# Patient Record
Sex: Female | Born: 1955 | Race: White | Hispanic: No | Smoking: Never smoker
Health system: Southern US, Community
[De-identification: ages and names within clinical notes are randomized; demographics above are authoritative.]

## PROBLEM LIST (undated history)

## (undated) DIAGNOSIS — R079 Chest pain, unspecified: Secondary | ICD-10-CM

## (undated) DIAGNOSIS — G8929 Other chronic pain: Secondary | ICD-10-CM

## (undated) DIAGNOSIS — E559 Vitamin D deficiency, unspecified: Secondary | ICD-10-CM

## (undated) DIAGNOSIS — R0602 Shortness of breath: Secondary | ICD-10-CM

## (undated) DIAGNOSIS — E669 Obesity, unspecified: Secondary | ICD-10-CM

## (undated) DIAGNOSIS — M25562 Pain in left knee: Secondary | ICD-10-CM

## (undated) DIAGNOSIS — F419 Anxiety disorder, unspecified: Secondary | ICD-10-CM

## (undated) DIAGNOSIS — E7800 Pure hypercholesterolemia, unspecified: Secondary | ICD-10-CM

## (undated) DIAGNOSIS — M797 Fibromyalgia: Secondary | ICD-10-CM

## (undated) DIAGNOSIS — M25561 Pain in right knee: Secondary | ICD-10-CM

## (undated) DIAGNOSIS — M2142 Flat foot [pes planus] (acquired), left foot: Secondary | ICD-10-CM

## (undated) DIAGNOSIS — F32A Depression, unspecified: Secondary | ICD-10-CM

## (undated) DIAGNOSIS — M1990 Unspecified osteoarthritis, unspecified site: Secondary | ICD-10-CM

## (undated) DIAGNOSIS — M2141 Flat foot [pes planus] (acquired), right foot: Secondary | ICD-10-CM

## (undated) DIAGNOSIS — M2550 Pain in unspecified joint: Secondary | ICD-10-CM

## (undated) DIAGNOSIS — D649 Anemia, unspecified: Secondary | ICD-10-CM

## (undated) HISTORY — DX: Fibromyalgia: M79.7

## (undated) HISTORY — DX: Anemia, unspecified: D64.9

## (undated) HISTORY — DX: Obesity, unspecified: E66.9

## (undated) HISTORY — DX: Pain in unspecified joint: M25.50

## (undated) HISTORY — DX: Vitamin D deficiency, unspecified: E55.9

## (undated) HISTORY — DX: Pure hypercholesterolemia, unspecified: E78.00

## (undated) HISTORY — DX: Flat foot (pes planus) (acquired), left foot: M21.42

## (undated) HISTORY — DX: Flat foot (pes planus) (acquired), right foot: M21.41

## (undated) HISTORY — DX: Pain in right knee: M25.562

## (undated) HISTORY — DX: Chest pain, unspecified: R07.9

## (undated) HISTORY — DX: Pain in left knee: M25.561

## (undated) HISTORY — DX: Unspecified osteoarthritis, unspecified site: M19.90

## (undated) HISTORY — DX: Other chronic pain: G89.29

## (undated) HISTORY — DX: Depression, unspecified: F32.A

## (undated) HISTORY — DX: Shortness of breath: R06.02

## (undated) HISTORY — DX: Anxiety disorder, unspecified: F41.9

## (undated) HISTORY — PX: TONSILLECTOMY: SUR1361

---

## 1989-02-01 HISTORY — PX: ABDOMINAL HYSTERECTOMY: SHX81

## 1989-02-01 HISTORY — PX: TOTAL ABDOMINAL HYSTERECTOMY: SHX209

## 2009-07-17 ENCOUNTER — Ambulatory Visit: Payer: Self-pay | Admitting: Family Medicine

## 2009-07-17 DIAGNOSIS — M79609 Pain in unspecified limb: Secondary | ICD-10-CM | POA: Insufficient documentation

## 2009-07-17 DIAGNOSIS — M797 Fibromyalgia: Secondary | ICD-10-CM | POA: Insufficient documentation

## 2009-07-17 DIAGNOSIS — N39498 Other specified urinary incontinence: Secondary | ICD-10-CM | POA: Insufficient documentation

## 2009-07-17 LAB — CONVERTED CEMR LAB
Bilirubin Urine: NEGATIVE
Blood in Urine, dipstick: NEGATIVE
Glucose, Urine, Semiquant: NEGATIVE
Ketones, urine, test strip: NEGATIVE
Nitrite: NEGATIVE
WBC Urine, dipstick: NEGATIVE

## 2009-07-18 LAB — CONVERTED CEMR LAB
Albumin: 4.6 g/dL (ref 3.5–5.2)
BUN: 16 mg/dL (ref 6–23)
CO2: 23 meq/L (ref 19–32)
Glucose, Bld: 101 mg/dL — ABNORMAL HIGH (ref 70–99)
HCT: 42.4 % (ref 36.0–46.0)
Hemoglobin: 13.7 g/dL (ref 12.0–15.0)
MCHC: 32.3 g/dL (ref 30.0–36.0)
Magnesium: 2 mg/dL (ref 1.5–2.5)
Potassium: 4.4 meq/L (ref 3.5–5.3)
RBC: 4.62 M/uL (ref 3.87–5.11)
Sodium: 137 meq/L (ref 135–145)
Total CK: 142 units/L (ref 7–177)
Total Protein: 6.8 g/dL (ref 6.0–8.3)

## 2009-07-19 ENCOUNTER — Telehealth: Payer: Self-pay | Admitting: Family Medicine

## 2009-07-31 ENCOUNTER — Ambulatory Visit: Payer: Self-pay | Admitting: Family Medicine

## 2009-07-31 DIAGNOSIS — N318 Other neuromuscular dysfunction of bladder: Secondary | ICD-10-CM | POA: Insufficient documentation

## 2009-08-01 ENCOUNTER — Encounter: Admission: RE | Admit: 2009-08-01 | Discharge: 2009-08-29 | Payer: Self-pay | Admitting: Family Medicine

## 2009-08-16 ENCOUNTER — Encounter: Payer: Self-pay | Admitting: Family Medicine

## 2009-08-28 ENCOUNTER — Ambulatory Visit: Payer: Self-pay | Admitting: Family Medicine

## 2009-11-27 ENCOUNTER — Telehealth: Payer: Self-pay | Admitting: Family Medicine

## 2009-11-27 ENCOUNTER — Ambulatory Visit: Payer: Self-pay | Admitting: Family Medicine

## 2009-11-27 LAB — CONVERTED CEMR LAB
Bilirubin Urine: NEGATIVE
Glucose, Urine, Semiquant: NEGATIVE
Ketones, urine, test strip: NEGATIVE
Nitrite: NEGATIVE
Protein, U semiquant: NEGATIVE
Specific Gravity, Urine: <1.005
Urobilinogen, UA: 0.2
WBC Urine, dipstick: NEGATIVE
pH: 6

## 2009-11-27 LAB — HM MAMMOGRAPHY: HM Mammogram: NORMAL

## 2009-12-19 ENCOUNTER — Encounter: Payer: Self-pay | Admitting: Family Medicine

## 2010-01-03 ENCOUNTER — Ambulatory Visit: Payer: Self-pay | Admitting: Family Medicine

## 2010-04-02 ENCOUNTER — Ambulatory Visit: Payer: Self-pay | Admitting: Family Medicine

## 2010-04-02 DIAGNOSIS — F341 Dysthymic disorder: Secondary | ICD-10-CM | POA: Insufficient documentation

## 2010-04-30 ENCOUNTER — Ambulatory Visit: Payer: Self-pay | Admitting: Family Medicine

## 2010-06-03 NOTE — Progress Notes (Signed)
Summary: PT for bladder  Phone Note Call from Patient Call back at Work Phone [phone]   Caller: Patient Call For: Carolyn Gasser MD Summary of Call: pt calls back and would like to have the PT fior her bladder scheduled Initial call taken by: Kathlene November,  July 19, 2009 9:27 AM

## 2010-06-03 NOTE — Assessment & Plan Note (Signed)
Summary: RASH/POISON OAK   Vital Signs:  Patient profile:   55 year old female Height:      65 inches Weight:      190 pounds Pulse rate:   75 / minute BP sitting:   127 / 88  (left arm) Cuff size:   regular  Vitals Entered By: Avon Gully CMA, Duncan Dull) (January 03, 2010 2:48 PM) CC: possible poison ivey,pt was working in the yard yesterday and she did get bit by several ants, develpoed itchy weeping rash on the arms   Primary Care Alania Overholt:  Nani Gasser MD  CC:  possible poison ivey, pt was working in the yard yesterday and she did get bit by several ants, and develpoed itchy weeping rash on the arms.  History of Present Illness: possible poison ivey,pt was working in the yard yesterday and she did get bit by several ants, develpoed itchy weeping rash on the arms. Was bitten on Tuesdays andthen next noticed very itchy and then then following day noticed the welps  on her forearms.  Even has new lesions this AM.  Using Iverest and ivadry on it and not really helping.    Current Medications (verified): 1)  Vivelle-Dot 0.05 Mg/24hr Pttw (Estradiol) .... Change Patch Twice A Week 2)  Flexeril 5 Mg Tabs (Cyclobenzaprine Hcl) .... 1/2 - 1 Tab By Mouth At Bedtime. 3)  Diclofenac Sodium 75 Mg Tbec (Diclofenac Sodium) .... Take 1 Tablet By Mouth Two Times A Day As Needed 4)  Bactrim Ds 800-160 Mg Tabs (Sulfamethoxazole-Trimethoprim) .... Take 1 Tablet By Mouth Two Times A Day For 3 Days.  Allergies (verified): No Known Drug Allergies  Comments:  Nurse/Medical Assistant: The patient's medications and allergies were reviewed with the patient and were updated in the Medication and Allergy Lists. Avon Gully CMA, Duncan Dull) (January 03, 2010 2:49 PM)  Physical Exam  General:  Well-developed,well-nourished,in no acute distress; alert,appropriate and cooperative throughout examination Skin:  She has erythematou very raised papules scattered on her left forearm that are 0.5  to 1 cm in size. Has a vesicular lesion on her right wrist.    Impression & Recommendations:  Problem # 1:  RHUS DERMATITIS (ICD-692.6) some of the lesion look like ant bites and some look like allergic dermatitis from poison ivey or oak. Dsicussed topical vs oral treatment and that it is not contagious or "spreading" .  She prefered oral steroids. Discussed the likely SE of the medication. Call if not better in one week.  Her updated medication list for this problem includes:    Prednisone 20 Mg Tabs (Prednisone) .Marland Kitchen... 2 tab by mouth daily for 7 days the once tab a day for 7 days and then stop.  Complete Medication List: 1)  Vivelle-dot 0.05 Mg/24hr Pttw (Estradiol) .... Change patch twice a week 2)  Flexeril 5 Mg Tabs (Cyclobenzaprine hcl) .... 1/2 - 1 tab by mouth at bedtime. 3)  Diclofenac Sodium 75 Mg Tbec (Diclofenac sodium) .... Take 1 tablet by mouth two times a day as needed 4)  Bactrim Ds 800-160 Mg Tabs (Sulfamethoxazole-trimethoprim) .... Take 1 tablet by mouth two times a day for 3 days. 5)  Prednisone 20 Mg Tabs (Prednisone) .... 2 tab by mouth daily for 7 days the once tab a day for 7 days and then stop.  Patient Instructions: 1)  Call if not better in one week.  Prescriptions: PREDNISONE 20 MG TABS (PREDNISONE) 2 tab by mouth daily for 7 days the once tab a  day for 7 days and then stop.  #21 x 0   Entered and Authorized by:   Nani Gasser MD   Signed by:   Nani Gasser MD on 01/03/2010   Method used:   Electronically to        [facility] (retail)       [address]       [place], [place]  [address]       Ph: [phone]       Fax: [phone]   RxID:   [id number]

## 2010-06-03 NOTE — Progress Notes (Signed)
Summary: Mammo resutls  Phone Note Outgoing Call      Call pt: Normal Mammogram. Return for follow up Mammogram in 1 year Done at [facility]. July 27, 201112:23 PM Keona Sheffler MD, Santina Evans  11/27/09 left a message on voicmail that results are normal.acm1:30

## 2010-06-03 NOTE — Assessment & Plan Note (Signed)
Summary: 4 mo. f/u fibro, mood   Vital Signs:  Patient profile:   55 year old female Height:      65 inches Weight:      192 pounds Pulse rate:   77 / minute BP sitting:   121 / 77  (right arm) Cuff size:   regular  Vitals Entered By: Avon Gully CMA, Duncan Dull) (April 02, 2010 8:43 AM) CC: f/u fibromyalgia   Primary Care Provider:  Nani Gasser MD  CC:  f/u fibromyalgia.  History of Present Illness: f/u fibromyalgia.  Says her fibromyalgia has been flaring.  Still struggling with her neck and shoulder pain.  Hasn't been donig the exercises.  Feels her mood is down. Has been very stressed at work and at home.  will occ take the flexeril and it does help but occ does wake up.  She is tearful here in the office today. she definitely says she feels to have and somewhat anxious.  was on zoloft for year about 10 or 12 years ago.  She said she tolerated this really well and did not have any side effects.  She did find that it was helpful at that time.  Current Medications (verified): 1)  Vivelle-Dot 0.05 Mg/24hr Pttw (Estradiol) .... Change Patch Twice A Week 2)  Flexeril 5 Mg Tabs (Cyclobenzaprine Hcl) .... 1/2 - 1 Tab By Mouth At Bedtime. 3)  Diclofenac Sodium 75 Mg Tbec (Diclofenac Sodium) .... Take 1 Tablet By Mouth Two Times A Day As Needed  Allergies (verified): No Known Drug Allergies  Comments:  Nurse/Medical Assistant: The patient's medications and allergies were reviewed with the patient and were updated in the Medication and Allergy Lists. Avon Gully CMA, Duncan Dull) (April 02, 2010 8:44 AM)  Physical Exam  General:  Well-developed,well-nourished,in no acute distress; alert,appropriate and cooperative throughout examination Head:  Normocephalic and atraumatic without obvious abnormalities. No apparent alopecia or balding. Lungs:  Normal respiratory effort, chest expands symmetrically. Lungs are clear to auscultation, no crackles or wheezes. Heart:   Normal rate and regular rhythm. S1 and S2 normal without gallop, murmur, click, rub or other extra sounds. Psych:  Cognition and judgment appear intact. Alert and cooperative with normal attention span and concentration. No apparent delusions, illusions, hallucinations. she is tearful.   Impression & Recommendations:  Problem # 1:  FIBROMYALGIA (ICD-729.1) Assessment Deteriorated we discussed that certainly her fibromyalgia could be flaring because of her recent change in mood.  I think we should focus in on treating her depression and anxiety and see if her fibromyalgia improved.  In addition regular exercise and stretching will help her.  I reminded her to be as consistent with this as she can. Her updated medication list for this problem includes:    Flexeril 5 Mg Tabs (Cyclobenzaprine hcl) .Marland Kitchen... 1/2 - 1 tab by mouth at bedtime.    Diclofenac Sodium 75 Mg Tbec (Diclofenac sodium) .Marland Kitchen... Take 1 tablet by mouth two times a day as needed  Problem # 2:  DEPRESSION/ANXIETY (ICD-300.4) Assessment: New PHQ-9 score of 19 (moderately severe) GAD-7 score of 13. In the she clearly has anxiety and depression that I think her depression and is more prominent.  We discussed different options.  She has done well on the Zoloft in the past I would like to restart this as of the days.  Then follow her up in 3 to 4 weeks and we can adjust her dose and she shall she's doing at that time.  Discussed potential side effects.  Call if she has any thoughts of harming herself.  She currently denies any suicidal thoughts.I also offered counseling, but she was not interested at this time.  Complete Medication List: 1)  Vivelle-dot 0.05 Mg/24hr Pttw (Estradiol) .... Change patch twice a week 2)  Flexeril 5 Mg Tabs (Cyclobenzaprine hcl) .... 1/2 - 1 tab by mouth at bedtime. 3)  Diclofenac Sodium 75 Mg Tbec (Diclofenac sodium) .... Take 1 tablet by mouth two times a day as needed 4)  Sertraline Hcl 50 Mg Tabs (Sertraline  hcl) .... 1/2 tab by mouth daily for one week then increase to whole tab.  Patient Instructions: 1)  Please schedule a follow-up appointment in 3-4 weeks for mood.  Prescriptions: SERTRALINE HCL 50 MG TABS (SERTRALINE HCL) 1/2 tab by mouth daily for one week then increase to whole tab.  #30 x 0   Entered and Authorized by:   Nani Gasser MD   Signed by:   Nani Gasser MD on 04/02/2010   Method used:   Electronically to        [facility] (retail)       [address]       [place], [place]  [address]       Ph: [phone]       Fax: [phone]   RxID:   [id number]    Orders Added: 1)  Est. Patient Level IV [56433]

## 2010-06-03 NOTE — Assessment & Plan Note (Signed)
Summary: 3 month fu fibro, UTI   Vital Signs:  Patient profile:   55 year old female Height:      65 inches Weight:      190 pounds Pulse rate:   84 / minute BP sitting:   119 / 73  (left arm) Cuff size:   regular  Vitals Entered By: Avon Gully CMA, Duncan Dull) (November 27, 2009 9:10 AM) CC: f/u fibromyalgia,doing better overall  but not sleeping at night farom discomfort,cramping and lower abd ppain on off for past week   Primary Care Provider:  Nani Gasser MD  CC:  f/u fibromyalgia, doing better overall  but not sleeping at night farom discomfort, and cramping and lower abd ppain on off for past week.  History of Present Illness: f/u fibromyalgia,doing better overall  but not sleeping at night from discomfort,cramping and lower abd pain on off for past week. Had urinary urgency and dysuria.  Started AZO and did help some.  Still not sleeping well last few weeks.  Has mentally been more stressed.  Didn't try goingup on half of tab of the flexeril.  Sometime will get stress at work and will feel some tension and prickling in her shoulders. Not doing any stretchdes or exercises for her neck and shoulders. Has been walking more.    ENd of April fell and hit the back of her head adn had a goose egg.  It eventually went down.    Current Medications (verified): 1)  Vivelle-Dot 0.05 Mg/24hr Pttw (Estradiol) .... Change Patch Twice A Week 2)  Flexeril 5 Mg Tabs (Cyclobenzaprine Hcl) .... 1/2 - 1 Tab By Mouth At Bedtime. 3)  Diclofenac Sodium 75 Mg Tbec (Diclofenac Sodium) .... Take 1 Tablet By Mouth Two Times A Day As Needed  Allergies (verified): No Known Drug Allergies  Comments:  Nurse/Medical Assistant: The patient's medications and allergies were reviewed with the patient and were updated in the Medication and Allergy Lists. Avon Gully CMA, Duncan Dull) (November 27, 2009 9:12 AM)  Physical Exam  General:  Well-developed,well-nourished,in no acute distress; alert,appropriate  and cooperative throughout examination Head:  Normocephalic and atraumatic without obvious abnormalities. No apparent alopecia or balding. Lungs:  Normal respiratory effort, chest expands symmetrically. Lungs are clear to auscultation, no crackles or wheezes. Heart:  Normal rate and regular rhythm. S1 and S2 normal without gallop, murmur, click, rub or other extra sounds. Msk:  Neck with normal flexion, extenion, rotation. Slightly limited sidebend bilat and painful in both directions Shoulder with NROM and strength 5/5.  Skin:  no rashes.   Psych:  Cognition and judgment appear intact. Alert and cooperative with normal attention span and concentration. No apparent delusions, illusions, hallucinations   Impression & Recommendations:  Problem # 1:  FIBROMYALGIA (ICD-729.1) Assessment Unchanged Can try increasing her flexeril to 1 tab and see if helps her sleep.  REally encouraged her toget back exercise overall and will give her handout for neck and shoulder stretches. f/u in 4 months.  Monitor for GI SE with the diclofenac.   Her updated medication list for this problem includes:    Flexeril 5 Mg Tabs (Cyclobenzaprine hcl) .Marland Kitchen... 1/2 - 1 tab by mouth at bedtime.    Diclofenac Sodium 75 Mg Tbec (Diclofenac sodium) .Marland Kitchen... Take 1 tablet by mouth two times a day as needed  Her updated medication list for this problem includes:    Flexeril 5 Mg Tabs (Cyclobenzaprine hcl) .Marland Kitchen... 1/2 - 1 tab by mouth at bedtime.    Diclofenac  Sodium 75 Mg Tbec (Diclofenac sodium) .Marland Kitchen... Take 1 tablet by mouth two times a day as needed  Problem # 2:  UPPER URINARY TRACT INFECTION (ICD-590.80)  Her updated medication list for this problem includes:    Bactrim Ds 800-160 Mg Tabs (Sulfamethoxazole-trimethoprim) .Marland Kitchen... Take 1 tablet by mouth two times a day for 3 days.  Encouraged to push clear liquids, get enough rest, and take acetaminophen as needed. To be seen in 10 days if no improvement, sooner if  worse.  Orders: UA Dipstick w/o Micro (automated)  (81003)  Complete Medication List: 1)  Vivelle-dot 0.05 Mg/24hr Pttw (Estradiol) .... Change patch twice a week 2)  Flexeril 5 Mg Tabs (Cyclobenzaprine hcl) .... 1/2 - 1 tab by mouth at bedtime. 3)  Diclofenac Sodium 75 Mg Tbec (Diclofenac sodium) .... Take 1 tablet by mouth two times a day as needed 4)  Bactrim Ds 800-160 Mg Tabs (Sulfamethoxazole-trimethoprim) .... Take 1 tablet by mouth two times a day for 3 days.  Patient Instructions: 1)  Start stretches daily in the evening for shoulder and neck. 2)  Please schedule a follow-up appointment in 4 months .  3)  Call if still having urinary sxs after you complete the antibiotic.  Prescriptions: DICLOFENAC SODIUM 75 MG TBEC (DICLOFENAC SODIUM) Take 1 tablet by mouth two times a day as needed  #60 x 1   Entered and Authorized by:   Nani Gasser MD   Signed by:   Nani Gasser MD on 11/27/2009   Method used:   Electronically to        [facility] (retail)       [address]       [place], [place]  [address]       Ph: [phone]       Fax: [phone]   RxID:   [id number] BACTRIM DS 800-160 MG TABS (SULFAMETHOXAZOLE-TRIMETHOPRIM) Take 1 tablet by mouth two times a day for 3 days.  #6 x 0   Entered and Authorized by:   Nani Gasser MD   Signed by:   Nani Gasser MD on 11/27/2009   Method used:   Electronically to        [facility] (retail)       [address]       [place], [place]  [address]       Ph: [phone]       Fax: [phone]   RxID:   [id number]   Laboratory Results   Urine Tests  Date/Time Received: 11/27/09 Date/Time Reported: 11/27/09  Routine Urinalysis   Color: yellow Appearance: Clear Glucose: negative   (Normal Range: Negative) Bilirubin: negative   (Normal Range: Negative) Ketone: negative   (Normal Range: Negative) Spec. Gravity: <1.005   (Normal Range: 1.003-1.035) Blood: trace-intact   (Normal Range: Negative) pH: 6.0    (Normal Range: 5.0-8.0) Protein: negative   (Normal Range: Negative) Urobilinogen: 0.2   (Normal Range: 0-1) Nitrite: negative   (Normal Range: Negative) Leukocyte Esterace: negative   (Normal Range: Negative)

## 2010-06-03 NOTE — Letter (Signed)
Summary: [facility]  [facility]   Imported By: Lanelle Bal 09/20/2009 12:04:09  _____________________________________________________________________  External Attachment:    Type:   Image     Comment:   External Document

## 2010-06-03 NOTE — Assessment & Plan Note (Signed)
Summary: NOV: Myalgis, stress incontinence.    Vital Signs:  Patient profile:   55 year old female Height:      65 inches Weight:      186 pounds BMI:     31.06 Temp:     98.4 degrees F oral Pulse rate:   70 / minute BP sitting:   137 / 75  (left arm) Cuff size:   regular  Vitals Entered By: Kathlene November (July 17, 2009 8:36 AM) CC: NP- get established- having issues with stress incontinence and pain in feet, neck, shoulder and hips- does see a chiropractor 1 x a month Is Patient Diabetic? No   CC:  NP- get established- having issues with stress incontinence and pain in feet, neck, and shoulder and hips- does see a chiropractor 1 x a month.  History of Present Illness: Feels has muscles aches and pains for about for several years  . has been seeing a chiropracter for once a 1 month.  Usually will take Aleve (often 2 a day).  Sometimes feel bruised to the touch. If her cat walks on her it is painful.  Often tender in her hips, shoulders and legs.  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  does have some tension in her shoulder and neck from this. Hx of old injury in 04/2005 when fell adn hit the back of her head after falling in her daughters carport. Has a normal neck and shoulder MRI but it still bothers her sometimes.  Occ will feel a beestings in her shoulder and overthe deltoid. But also notices pain in the other shoulder. Has had cortisone shots in hip for busitis.    Saw Dr sprinkle about 3 years ago and given celebrex adn inserts adn this did help, but lately feet are very painful.  She is on her feet a fiar amount. Rest doesn' t always alleviate her pain.  Would like to see a new podiatrist. Now sometimes uses IBU - helps some but always painful again in the morning.   Hx of stress incontinence. Has had 2 kids. Worse with sneezing and coughing.  Having more frequency as well.  Started about a year ago.  Often will urinate 4 x in the AM before even leaves her house to go to work.    Habits & Providers  Alcohol-Tobacco-Diet     Alcohol drinks/day: 0     Tobacco Status: never  Exercise-Depression-Behavior     Does Patient Exercise: yes     Type of exercise: walking dog, gardening     Exercise (avg: min/session): <30     Times/week: 7     STD Risk: never     Drug Use: no     Seat Belt Use: always  Current Medications (verified): 1)  Vivelle-Dot 0.05 Mg/24hr Pttw (Estradiol) .... Change Patch Twice A Week  Allergies (verified): No Known Drug Allergies  Comments:  Nurse/Medical Assistant: The patient's medications and allergies were reviewed with the patient and were updated in the Medication and Allergy Lists. 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  Married to [place] with 4 kids.   Never Smoked Alcohol use-no Drug use-no Regular exercise-yes 4-5 caffeinated drinks per day. Smoking Status:  never Does Patient Exercise:  yes STD Risk:  never Drug Use:  no Seat Belt Use:  always  Review of Systems       No fever/sweats/weakness, unexplained weight loss/gain.  No vison changes.  No difficulty hearing/ringing in ears, hay fever/allergies.  No chest pain/discomfort, palpitations.  No Br lump/nipple discharge.  No cough/wheeze.  No blood in BM, nausea/vomiting/diarrhea.  No nighttime urination, + leaking urine, no unusual vaginal bleeding, discharge (penis or vagina).  + muscle/joint pain. No rash, change in mole.  No HA, memory loss.  No anxiety, sleep d/o, depression.  No easy bruising/bleeding, unexplained lump   Physical Exam  General:  Well-developed,well-nourished,in no acute distress; alert,appropriate and cooperative throughout examination Head:  Normocephalic and atraumatic  without obvious abnormalities. No apparent alopecia or balding. Neck:  No deformities, masses, or tenderness noted. NO TM.  Lungs:  Normal respiratory effort, chest expands symmetrically. Lungs are clear to auscultation, no crackles or wheezes. Heart:  Normal rate and regular rhythm. S1 and S2 normal without gallop, murmur, click, rub or other extra sounds. Msk:  UE and LE with NROM and with strength 5/5 in both. Neck with NROM.  Has 8 out of 18 tender points on upper and lower body and on right and left side of body.  No swollen or red joints.  Pulses:  Radial 2= bilat.  Extremities:  No LE edema.  Neurologic:  alert & oriented X3, gait normal, and DTRs symmetrical and normal.   Skin:  no rashes.   Cervical Nodes:  No lymphadenopathy noted Psych:  Cognition and judgment appear intact. Alert and cooperative with normal attention span and concentration. No apparent delusions, illusions, hallucinations   Impression & Recommendations:  Problem # 1:  MYALGIA (ICD-729.1) Dsicussed differential for muscles d/o, fibromyalgia versus individual joint d/o of the shoulders, neck and hips.  Based on her hx I ammost suspicious of fibromyalgia. will get labs today to rule out other d/o , thyroid dz, and inflammation. F/U in 2 weeks to further discuss labs and discuss potential treatment. I explained that since she sees a chiropracter and an orhtopedist that my role is to make sure no systemic process going on.   Problem # 2:  STRESS INCONTINENCE (ICD-788.39)  UA is neg today Has stress incontinence and OAB.   Discussed treatments incluiding PT, medication, and having schedule bathrroom breaks to keep her bladder from becoming overfull.    Orders: UA Dipstick w/o Micro (automated)  (81003)  Problem # 3:  FOOT PAIN, BILATERAL (ICD-729.5) Will refer today. Didn't exam her feet today but since her problems are more chronic will schdule her with a  new podiatrist.  Orders: Podiatry Referral  (Podiatry)  Complete Medication List: 1)  Vivelle-dot 0.05 Mg/24hr Pttw (Estradiol) .... Change patch twice a week  Other Orders: T-TSH ([id number]) T-Comprehensive Metabolic Panel [id number]) T-CBC No Diff ([id number]) T-Sed Rate (Automated) [id number]) T-CK Total [id number]) T-Vitamin D (25-Hydroxy) [phone]) T-Vitamin B12 ([id number]) T-Magnesium ([id number])  Patient Instructions: 1)  Please schedule a follow-up appointment in 2 weeks to review labs and discuss possible treatment.   Laboratory Results   Urine Tests  Date/Time Received: 07/17/2009 Date/Time Reported: 07/17/2009  Routine Urinalysis   Color: yellow Appearance: Clear Glucose: negative   (Normal Range: Negative) Bilirubin: negative   (Normal Range: Negative) Ketone: negative   (Normal Range: Negative) Spec. Gravity: 1.010   (Normal Range: 1.003-1.035) Blood: negative   (Normal Range: Negative) pH: 6.5   (Normal Range: 5.0-8.0) Protein: negative   (Normal Range: Negative) Urobilinogen: 0.2   (  Normal Range: 0-1) Nitrite: negative   (Normal Range: Negative) Leukocyte Esterace: negative   (Normal Range: Negative)

## 2010-06-03 NOTE — Letter (Signed)
Summary: [facility]  [facility]   Imported By: Lanelle Bal 01/14/2010 11:54:33  _____________________________________________________________________  External Attachment:    Type:   Image     Comment:   External Document

## 2010-06-03 NOTE — Assessment & Plan Note (Signed)
Summary: F/u fibro, OAB, bilat foot pain   Vital Signs:  Patient profile:   55 year old female Height:      65 inches Weight:      186 pounds Pulse rate:   68 / minute BP sitting:   125 / 71  (left arm) Cuff size:   regular  Vitals Entered By: Kathlene November (August 28, 2009 8:25 AM) CC: followup fibromyalgia- pt states doing well with the Flexeril   Primary Care Provider:  Nani Gasser MD  CC:  followup fibromyalgia- pt states doing well with the Flexeril.  History of Present Illness: followup fibromyalgia- pt states doing well with the Flexeril.  Still having some pain in her neck and shoulder. Goes to the chiropracter at the end of the month and this does help. Says was also given exercises for these as well and says does feel better when she remembers to do this.  Currently using Aleve for pain relief but feels it is really not helping. Sits at a computer all day.   Current Medications (verified): 1)  Vivelle-Dot 0.05 Mg/24hr Pttw (Estradiol) .... Change Patch Twice A Week 2)  Flexeril 5 Mg Tabs (Cyclobenzaprine Hcl) .... 1/2 - 1 Tab By Mouth At Bedtime.  Allergies (verified): No Known Drug Allergies  Comments:  Nurse/Medical Assistant: The patient's medications and allergies were reviewed with the patient and were updated in the Medication and Allergy Lists. 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  Married to Elaine  with 4 kids.   Never Smoked Alcohol use-no Drug use-no Regular exercise-yes 4-5 caffeinated drinks per day.   Physical Exam  General:  Well-developed,well-nourished,in no acute distress; alert,appropriate and cooperative throughout examination Lungs:  Normal respiratory effort, chest expands symmetrically. Lungs are clear to auscultation, no crackles or wheezes. Heart:  Normal rate and regular rhythm. S1 and S2 normal without gallop, murmur, click,  rub or other extra sounds. Msk:  Tender over bilat traps.  Neck with NROM.  Skin:  no rashes.   Psych:  Cognition and judgment appear intact. Alert and cooperative with normal attention span and concentration. No apparent delusions, illusions, hallucinations   Impression & Recommendations:  Problem # 1:  FIBROMYALGIA (ICD-729.1) Feels she is sleeping better.  Still having pain in her neck and shoulders but feels her hips are a little better. Will change to diclofenac since she feels the Aleve is not really helping. Also encouraged her to try adding tyelnol if needed for more severe pain. Can conintue the flexeril at bedtime since it is helping.   Her updated medication list for this problem includes:    Flexeril 5 Mg Tabs (Cyclobenzaprine hcl) .Marland Kitchen... 1/2 - 1 tab by mouth at bedtime.    Diclofenac Sodium 75 Mg Tbec (Diclofenac sodium) .Marland Kitchen... Take 1 tablet by mouth two times a day as needed  Problem # 2:  OVERACTIVE BLADDER (ICD-596.51) Started with the therapy and has really dec her caffeine overall. Has now one cup of cofee adn one cup of tea.  Did try the vesicare adn it worked well but after learning to cut back her caffeine she has stopped her vesicare and is still doing very well.    Problem # 3:  FOOT PAIN, BILATERAL (ICD-729.5) Saw Dr. 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 Didn't see Dr. Yates Decamp.  Still having pain i foon feet for 3-4 hours. Had a steroid injection  for the neuroma. Starting PT for her feet this week for the plantar fascititis. She is icing it.    Complete Medication List: 1)  Vivelle-dot 0.05 Mg/24hr Pttw (Estradiol) .... Change patch twice a week 2)  Flexeril 5 Mg Tabs (Cyclobenzaprine hcl) .... 1/2 - 1 tab by mouth at bedtime. 3)  Diclofenac Sodium 75 Mg Tbec (Diclofenac sodium) .... Take 1 tablet by mouth two times a day as needed  Other Orders: Tdap => 82yrs IM (64403) Admin 1st Vaccine (47425)  Patient Instructions: 1)  Trial of  the diclofenac instead of the Aleve. Can also use Extra Strength Tylenol in addition to this if needed for pain control.  2)  Please schedule a follow-up appointment in 2-3 months.  Prescriptions: DICLOFENAC SODIUM 75 MG TBEC (DICLOFENAC SODIUM) Take 1 tablet by mouth two times a day as needed  #60 x 1   Entered and Authorized by:   Nani Gasser MD   Signed by:   Nani Gasser MD on 08/28/2009   Method used:   Electronically to        [facility] (retail)       [address]       [place], [place]  [address]       Ph: [phone]       Fax: [phone]   RxID:   [id number]      Immunizations Administered:  Tetanus Vaccine:    Vaccine Type: Tdap    Site: right deltoid    Mfr: GlaxoSmithKline    Dose: 0.5 ml    Route: IM    Given by: Kathlene November    Exp. Date: 07/27/2011    Lot #: [id number]    VIS given: 03/22/07 version given August 28, 2009.

## 2010-06-03 NOTE — Assessment & Plan Note (Signed)
Summary: F/U myalgias, OAB   Vital Signs:  Patient profile:   55 year old female Height:      65 inches Weight:      187 pounds Pulse rate:   72 / minute BP sitting:   104 / 71  (left arm) Cuff size:   regular  Vitals Entered By: Kathlene November (July 31, 2009 8:36 AM) CC: followup treatment options for pain control   Primary Care Provider:  Nani Gasser MD  CC:  followup treatment options for pain control.  History of Present Illness: followup treatment options for pain control.  Walks 10-12 minutes a day about 4 days a week.  Still having feet pain. Had to reschedule her podiatry. Was tried on an SSRI about a year ago but only tolerated it one week was too sedating. Occ takes a Xanax as prescribed by her OB.  Uses it occ to sleep.  Occ uses a OTC herbal sleep medicine. Normally wakes up 3-4 hours after finally falls asleep. Sometimes can be several hours before falls back asleep.  Husband is also up and down during the night b/c of his medical problems.  Feels she is a light sleeper.  Was on Zoloft years ago and tolerated it well.   Current Medications (verified): 1)  Vivelle-Dot 0.05 Mg/24hr Pttw (Estradiol) .... Change Patch Twice A Week  Allergies (verified): No Known Drug Allergies  Comments:  Nurse/Medical Assistant: The patient's medications and allergies were reviewed with the patient and were updated in the Medication and Allergy Lists. 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   Never Smoked Alcohol use-no Drug use-no Regular exercise-yes 4-5 caffeinated drinks per day.   Physical Exam  General:  Well-developed,well-nourished,in no acute distress; alert,appropriate and cooperative throughout examination   Impression & Recommendations:  Problem # 1:  MYALGIA (ICD-729.1)  I do think she had fibromyalgia.  Discussed treatment including exercise, SSRIs, working on  sleep, and newer options like savella.   Will start wtih working on sleep. Start with half a tab at bedtime.  F/U in 1 month.  Also encouraged her to increase her exercise and keep her f/u with podiatry so that she can get her feet better to help her exercise. At f/u consider adding sertraline to her regimen since she has tolerated this well in the past.   Her updated medication list for this problem includes:    Flexeril 5 Mg Tabs (Cyclobenzaprine hcl) .Marland Kitchen... 1/2 - 1 tab by mouth at bedtime.  Problem # 2:  OVERACTIVE BLADDER (ICD-596.51) lets try vesicare 5mg  once a day for about 3 weeks. Has first PT session for stress incontinence tomorrow. F/U in 1 months.   Complete Medication List: 1)  Vivelle-dot 0.05 Mg/24hr Pttw (Estradiol) .... Change patch twice a week 2)  Flexeril 5 Mg Tabs (Cyclobenzaprine hcl) .... 1/2 - 1 tab by mouth at bedtime.  Patient Instructions: 1)  Please schedule a follow-up appointment in 1 month to follow up on pain.  2)  Samples of vesicare 5mg  to take in the AM once a day.  Prescriptions: FLEXERIL 5 MG TABS (CYCLOBENZAPRINE HCL) 1/2 - 1 tab by mouth at bedtime.  #30 x 0   Entered and Authorized by:   Nani Gasser MD   Signed by:   Nani Gasser MD on 07/31/2009   Method used:   Electronically to        [facility] (  retail)       [address]       [place], [place]  [address]       Ph: [phone]       Fax: [phone]   RxID:   [id number]

## 2010-06-03 NOTE — Letter (Signed)
Summary: Patient Health Questionnaire, Generalized Anxiety Disorder Scale  Patient Health Questionnaire, Generalized Anxiety Disorder Scale   Imported By: Maryln Gottron 04/10/2010 15:37:26  _____________________________________________________________________  External Attachment:    Type:   Image     Comment:   External Document

## 2010-06-04 ENCOUNTER — Encounter: Payer: Self-pay | Admitting: Family Medicine

## 2010-06-04 ENCOUNTER — Ambulatory Visit: Admit: 2010-06-04 | Payer: Self-pay | Admitting: Family Medicine

## 2010-06-04 ENCOUNTER — Ambulatory Visit (INDEPENDENT_AMBULATORY_CARE_PROVIDER_SITE_OTHER): Payer: No Typology Code available for payment source | Admitting: Family Medicine

## 2010-06-04 DIAGNOSIS — IMO0001 Reserved for inherently not codable concepts without codable children: Secondary | ICD-10-CM

## 2010-06-04 DIAGNOSIS — F341 Dysthymic disorder: Secondary | ICD-10-CM

## 2010-06-04 DIAGNOSIS — E559 Vitamin D deficiency, unspecified: Secondary | ICD-10-CM | POA: Insufficient documentation

## 2010-06-05 NOTE — Assessment & Plan Note (Signed)
Summary: 3-4 week f/u    Vital Signs:  Patient profile:   55 year old female Height:      65 inches Weight:      194 pounds BMI:     32.40 Pulse rate:   73 / minute BP sitting:   135 / 85  (right arm) Cuff size:   regular  Vitals Entered By: Avon Gully CMA, Duncan Dull) (April 30, 2010 8:32 AM) CC: f/u anxiety/depression   Primary Care Provider:  Nani Gasser MD  CC:  f/u anxiety/depression.  History of Present Illness: See A&P.  Has sinus congestion, drianage and ST that started last night. No fever. NO meds. Wants to know what she can take for it.   Current Medications (verified): 1)  Vivelle-Dot 0.05 Mg/24hr Pttw (Estradiol) .... Change Patch Twice A Week 2)  Flexeril 5 Mg Tabs (Cyclobenzaprine Hcl) .... 1/2 - 1 Tab By Mouth At Bedtime. 3)  Diclofenac Sodium 75 Mg Tbec (Diclofenac Sodium) .... Take 1 Tablet By Mouth Two Times A Day As Needed 4)  Sertraline Hcl 50 Mg Tabs (Sertraline Hcl) .... 1/2 Tab By Mouth Daily For One Week Then Increase To Whole Tab.  Allergies (verified): No Known Drug Allergies  Comments:  Nurse/Medical Assistant: The patient's medications and allergies were reviewed with the patient and were updated in the Medication and Allergy Lists. Avon Gully CMA, Duncan Dull) (April 30, 2010 8:33 AM)  Physical Exam  General:  Well-developed,well-nourished,in no acute distress; alert,appropriate and cooperative throughout examination Head:  Normocephalic and atraumatic without obvious abnormalities. No apparent alopecia or balding. Eyes:  No corneal or conjunctival inflammation noted. EOMI. Perrla. Ears:  External ear exam shows no significant lesions or deformities.  Otoscopic examination reveals clear canals, tympanic membranes are intact bilaterally without bulging, retraction, inflammation or discharge. Hearing is grossly normal bilaterally. Nose:  External nasal examination shows no deformity or inflammation. Nasal mucosa are pink and  moist without lesions or exudates. Mouth:  Oral mucosa and oropharynx without lesions or exudates.  Teeth in good repair. Neck:  No deformities, masses, or tenderness noted. Lungs:  Normal respiratory effort, chest expands symmetrically. Lungs are clear to auscultation, no crackles or wheezes. Heart:  Normal rate and regular rhythm. S1 and S2 normal without gallop, murmur, click, rub or other extra sounds. Skin:  no rashes.   Cervical Nodes:  No lymphadenopathy noted Psych:  Cognition and judgment appear intact. Alert and cooperative with normal attention span and concentration. No apparent delusions, illusions, hallucinations   Impression & Recommendations:  Problem # 1:  DEPRESSION/ANXIETY (ICD-300.4) PHQ-9 score 12 down from 19 GAD-7 score of 9 down from 13.  Overall she has made progress. Actually felt like she wanted to go back to work. Still lack of unmotivated. Still not feeling alot of joy.  Will inc the sertraine to 100mg  and f/u in 4 weeks.   Problem # 2:  URI (ICD-465.9)  Her updated medication list for this problem includes:    Diclofenac Sodium 75 Mg Tbec (Diclofenac sodium) .Marland Kitchen... Take 1 tablet by mouth two times a day as needed  Instructed on symptomatic treatment. Call if symptoms persist or worsen.   Complete Medication List: 1)  Vivelle-dot 0.05 Mg/24hr Pttw (Estradiol) .... Change patch twice a week 2)  Flexeril 5 Mg Tabs (Cyclobenzaprine hcl) .... 1/2 - 1 tab by mouth at bedtime. 3)  Diclofenac Sodium 75 Mg Tbec (Diclofenac sodium) .... Take 1 tablet by mouth two times a day as needed 4)  Sertraline Hcl  100 Mg Tabs (Sertraline hcl) .... Take 1 tablet by mouth once a day  Patient Instructions: 1)  Please schedule a follow-up appointment in 1 month for mood. 2)  Call if your sinuses are not better in 7-10 days. Can use mucinex or nasal saline rinse or a decongestant.   Prescriptions: SERTRALINE HCL 100 MG TABS (SERTRALINE HCL) Take 1 tablet by mouth once a day  #30  x 1   Entered and Authorized by:   Nani Gasser MD   Signed by:   Nani Gasser MD on 04/30/2010   Method used:   Electronically to        [facility] (retail)       [address]       [place], [place]  [address]       Ph: [phone]       Fax: [phone]   RxID:   [id number]    Orders Added: 1)  Est. Patient Level III [84132]

## 2010-06-05 NOTE — Letter (Signed)
Summary: Anxiety & Depression Questionnaire  Anxiety & Depression Questionnaire   Imported By: Lanelle Bal 05/12/2010 11:53:05  _____________________________________________________________________  External Attachment:    Type:   Image     Comment:   External Document

## 2010-06-11 NOTE — Assessment & Plan Note (Signed)
Summary: Fibro, Mood   Vital Signs:  Patient profile:   55 year old female Height:      65 inches Weight:      194 pounds Pulse rate:   73 / minute BP sitting:   135 / 74  (right arm) Cuff size:   regular  Vitals Entered By: Avon Gully CMA, Duncan Dull) (June 04, 2010 8:41 AM) CC: F/u fibromyalgia   Primary Care Provider:  Nani Gasser MD  CC:  F/u fibromyalgia.  History of Present Illness: Emotionally feels much better. Now havin hip, neck and shoulders are very tender. HER right upper arm is the worse.  Still not consistant with exercise. Knows she is not making good choices with her diet. Tolerating the higher dose sertraline well. No SE. No weight gain. Still struggling with sleep. Her husband has insomnia and chronic pain andhim getting up and down disturbs her sleep. She really doesn' thave another place to sleep in the home.  Says the flexeril helps her fall asleep but not stay asleep. She does occ use caffine in the afternoons to help her get through her work day.   Current Medications (verified): 1)  Vivelle-Dot 0.05 Mg/24hr Pttw (Estradiol) .... Change Patch Twice A Week 2)  Flexeril 5 Mg Tabs (Cyclobenzaprine Hcl) .... 1/2 - 1 Tab By Mouth At Bedtime. 3)  Diclofenac Sodium 75 Mg Tbec (Diclofenac Sodium) .... Take 1 Tablet By Mouth Two Times A Day As Needed 4)  Sertraline Hcl 100 Mg Tabs (Sertraline Hcl) .... Take 1 Tablet By Mouth Once A Day  Allergies (verified): No Known Drug Allergies  Comments:  Nurse/Medical Assistant: The patient's medications and allergies were reviewed with the patient and were updated in the Medication and Allergy Lists. Avon Gully CMA, Duncan Dull) (June 04, 2010 8:42 AM)  Physical Exam  General:  Well-developed,well-nourished,in no acute distress; alert,appropriate and cooperative throughout examination Head:  Normocephalic and atraumatic without obvious abnormalities. No apparent alopecia or balding. Lungs:  Normal  respiratory effort, chest expands symmetrically. Lungs are clear to auscultation, no crackles or wheezes. Heart:  Normal rate and regular rhythm. S1 and S2 normal without gallop, murmur, click, rub or other extra sounds. Skin:  Tender over her upper back, neck and upper arms. Tender over her outer hips and lateral thighs today.    Impression & Recommendations:  Problem # 1:  FIBROMYALGIA (ICD-729.1) Still discussed the importance of regular exercise Continue current regimen.  Work on sleep quality. Reviewed sleep hygiene measure. She says she really shoulder cut out her afternoon caffeine.   Her updated medication list for this problem includes:    Flexeril 5 Mg Tabs (Cyclobenzaprine hcl) .Marland Kitchen... 1/2 - 1 tab by mouth at bedtime.    Diclofenac Sodium 75 Mg Tbec (Diclofenac sodium) .Marland Kitchen... Take 1 tablet by mouth two times a day as needed    Problem # 2:  DEPRESSION/ANXIETY (ICD-300.4) PHQ-9 score 6. Not fully therapeutic for depression but her biggest complaints are sleepin and fatigue.  Work on sleep quality. Reviewed sleep hygiene measure. She says she really shoulder cut out her afternoon caffeine. GAD-7 score 2. Much improved from last time.   F/U in 3 months.   Complete Medication List: 1)  Vivelle-dot 0.05 Mg/24hr Pttw (Estradiol) .... Change patch twice a week 2)  Flexeril 5 Mg Tabs (Cyclobenzaprine hcl) .... 1/2 - 1 tab by mouth at bedtime. 3)  Diclofenac Sodium 75 Mg Tbec (Diclofenac sodium) .... Take 1 tablet by mouth two times a day  as needed 4)  Sertraline Hcl 100 Mg Tabs (Sertraline hcl) .... Take 1 tablet by mouth once a day  Other Orders: T-Vitamin D (25-Hydroxy) ([id number])  Patient Instructions: 1)  Please schedule a follow-up appointment in 3 months for mood and fibromyalgia  2)  Continue to work on your exercise. 3)  We will call you with your lab results.     Orders Added: 1)  T-Vitamin D (25-Hydroxy) [[id number]] 2)  Est. Patient Level III [25956]

## 2010-06-19 NOTE — Letter (Signed)
Summary: Patient Health Questionnaire  Patient Health Questionnaire   Imported By: Maryln Gottron 06/12/2010 11:08:53  _____________________________________________________________________  External Attachment:    Type:   Image     Comment:   External Document

## 2010-06-19 NOTE — Letter (Signed)
Summary: Generalized Anxiety Disorder Scale   Generalized Anxiety Disorder Scale   Imported By: Maryln Gottron 06/12/2010 11:22:14  _____________________________________________________________________  External Attachment:    Type:   Image     Comment:   External Document

## 2010-08-01 ENCOUNTER — Encounter: Payer: Self-pay | Admitting: Family Medicine

## 2010-08-03 ENCOUNTER — Encounter: Payer: Self-pay | Admitting: Family Medicine

## 2010-08-05 ENCOUNTER — Ambulatory Visit (INDEPENDENT_AMBULATORY_CARE_PROVIDER_SITE_OTHER): Payer: No Typology Code available for payment source | Admitting: Family Medicine

## 2010-08-05 VITALS — BP 125/82 | HR 70 | Ht 65.0 in | Wt 191.0 lb

## 2010-08-05 DIAGNOSIS — IMO0001 Reserved for inherently not codable concepts without codable children: Secondary | ICD-10-CM

## 2010-08-05 DIAGNOSIS — K2970 Gastritis, unspecified, without bleeding: Secondary | ICD-10-CM

## 2010-08-05 DIAGNOSIS — F341 Dysthymic disorder: Secondary | ICD-10-CM

## 2010-08-05 DIAGNOSIS — K2990 Gastroduodenitis, unspecified, without bleeding: Secondary | ICD-10-CM

## 2010-08-05 MED ORDER — SERTRALINE HCL 100 MG PO TABS: 100.0000 mg | ORAL_TABLET | Freq: Every day | ORAL | Status: DC

## 2010-08-05 MED ORDER — OMEPRAZOLE 20 MG PO TBEC: 20.0000 mg | DELAYED_RELEASE_TABLET | Freq: Every day | ORAL | Status: DC

## 2010-08-05 MED ORDER — CYCLOBENZAPRINE HCL 5 MG PO TABS: 5.0000 mg | ORAL_TABLET | ORAL | Status: DC

## 2010-08-05 NOTE — Assessment & Plan Note (Signed)
She is some better. Start PPI so she can use her NSAIDs for pain.  F/U in 2 mo.

## 2010-08-05 NOTE — Progress Notes (Signed)
  Subjective:    Patient ID: Carolyn [name], female    DOB: 13-Feb-1956, 55 y.o.   MRN: [id number]  HPI Here to f/u her anxiety/depression.  Does feel physically some better and feels emotionally she is better.  Has noticed more pain and soreness burt realized hadn't been taking the 2nd diclofenac. Then faithfully taking it bid for about 10 days.  Stopped it because stated getting stomach burning.    Pain in the left shoulder. Has been really sore. Started seeing a new chiropractor last week because previous clinic closed.  Now seeing Dr. Myrtis Ser. Has had 2 adjustments and has a repeat today at 3:30PM.  He feels like she has adrenal gland fatigue. Had xrays of neck and  Hips.  He also tested for metal toxicities that were neg. She is feeling some better.  She has been icing her neck and several areas in back tid. He really wants her to work on her stress levels.  No injury or trauma. Has has some pain in her right hip as well.  Has been using her flexeril as well.      Review of Systems     Objective:   Physical Exam      Left shoulder: She exhibits decreased range of motion. She exhibits no tenderness, no bony tenderness, no swelling, no deformity and normal strength.  Pain with extension to 90 degree. She is not able to fully extend compared to her right. Normal internal and external rotation but painful at the extremes. Neg empty can test. Strength 5/5 at the shoulder, elbow and wrist.    Assessment & Plan:  Gastritis from NSAIDs - Discussed options. Will start a PPI to protect her stomach so she can take her NSAIDs prn for her fibromyalgia. I would lrather do this than treat with narcotics.  Left shoulder pain - Supect bursitis or rotator cuff strain. At this point can use the diclofenac prn with the PPI to protect her stomach or change back to alevel is less irritating.  Continue chiropractic care since this is helping. If not continuing to improve then will be happy to make a north  referral.  25 min spent face to face discussing optins an dtreatment.

## 2010-08-05 NOTE — Assessment & Plan Note (Addendum)
PHQ-9 is 4. GAD-7 socre 2. Both improved from [place]. Continue current regimen and f/u in 2-3 months.

## 2010-09-03 ENCOUNTER — Ambulatory Visit: Payer: No Typology Code available for payment source | Admitting: Family Medicine

## 2010-11-04 ENCOUNTER — Ambulatory Visit (INDEPENDENT_AMBULATORY_CARE_PROVIDER_SITE_OTHER): Payer: No Typology Code available for payment source | Admitting: Family Medicine

## 2010-11-04 ENCOUNTER — Encounter: Payer: Self-pay | Admitting: Family Medicine

## 2010-11-04 DIAGNOSIS — F3289 Other specified depressive episodes: Secondary | ICD-10-CM

## 2010-11-04 DIAGNOSIS — M797 Fibromyalgia: Secondary | ICD-10-CM

## 2010-11-04 DIAGNOSIS — IMO0001 Reserved for inherently not codable concepts without codable children: Secondary | ICD-10-CM

## 2010-11-04 DIAGNOSIS — F32A Depression, unspecified: Secondary | ICD-10-CM

## 2010-11-04 DIAGNOSIS — F329 Major depressive disorder, single episode, unspecified: Secondary | ICD-10-CM

## 2010-11-04 NOTE — Progress Notes (Signed)
  Subjective:    Patient ID: Carolyn [name], female    DOB: 1956/04/22, 55 y.o.   MRN: [id number]  HPI Doing well on her mood.  Doing well on the sertraline and happy with her dose. No SE.    Says she never really took the PPI and the diclofenac. She has been working with chiropractor and is really doing well with her fibro right now.  Occ using tyelnol PM if felt achey after gardening. Says Still struggling with the tightness in her shoulders.  Thinking about getting massage therapy.    Review of Systems     Objective:   Physical Exam  Constitutional: She is oriented to person, place, and time. She appears well-developed and well-nourished.  HENT:  Head: Normocephalic and atraumatic.  Cardiovascular: Normal rate, regular rhythm and normal heart sounds.   Pulmonary/Chest: Effort normal and breath sounds normal.  Musculoskeletal:       Neck with Normal flexion, extension, rotation right and slightly dec to the left. Some discomfort with side bending.  Non-tender cervical spine.   Neurological: She is alert and oriented to person, place, and time.  Skin: Skin is warm and dry.  Psychiatric: She has a normal mood and affect.          Assessment & Plan:  Fibromyalgia - She is really doing well right now. I discussed trigger point pressure with a tennis ball and using a heat pack at night to relax her muscles. If does start the diclfenac then needs to restart her PPI.   Depression - looks great today. PHQ-9 2, GAD-7 score of 1. Very well controlled. F/u in 4 months.

## 2010-12-08 ENCOUNTER — Other Ambulatory Visit: Payer: Self-pay | Admitting: Family Medicine

## 2011-01-19 ENCOUNTER — Telehealth: Payer: Self-pay | Admitting: Family Medicine

## 2011-01-19 NOTE — Telephone Encounter (Signed)
Pt notified of results via VM. KJ LPN

## 2011-01-19 NOTE — Telephone Encounter (Signed)
Please call patient. Normal mammogram.  Repeat in 1 year.  

## 2011-01-22 ENCOUNTER — Other Ambulatory Visit: Payer: Self-pay | Admitting: Family Medicine

## 2011-03-11 ENCOUNTER — Encounter: Payer: Self-pay | Admitting: Family Medicine

## 2011-03-11 ENCOUNTER — Ambulatory Visit (INDEPENDENT_AMBULATORY_CARE_PROVIDER_SITE_OTHER): Payer: No Typology Code available for payment source | Admitting: Family Medicine

## 2011-03-11 VITALS — BP 122/73 | HR 66 | Wt 196.0 lb

## 2011-03-11 DIAGNOSIS — Z833 Family history of diabetes mellitus: Secondary | ICD-10-CM

## 2011-03-11 DIAGNOSIS — S0300XA Dislocation of jaw, unspecified side, initial encounter: Secondary | ICD-10-CM

## 2011-03-11 DIAGNOSIS — F3289 Other specified depressive episodes: Secondary | ICD-10-CM

## 2011-03-11 DIAGNOSIS — Z23 Encounter for immunization: Secondary | ICD-10-CM

## 2011-03-11 DIAGNOSIS — F329 Major depressive disorder, single episode, unspecified: Secondary | ICD-10-CM

## 2011-03-11 DIAGNOSIS — IMO0001 Reserved for inherently not codable concepts without codable children: Secondary | ICD-10-CM

## 2011-03-11 DIAGNOSIS — F32A Depression, unspecified: Secondary | ICD-10-CM

## 2011-03-11 DIAGNOSIS — Z13220 Encounter for screening for lipoid disorders: Secondary | ICD-10-CM

## 2011-03-11 DIAGNOSIS — M797 Fibromyalgia: Secondary | ICD-10-CM

## 2011-03-11 NOTE — Progress Notes (Signed)
  Subjective:    Patient ID: Carolyn [name], female    DOB: [birth year], 55 y.o.   MRN: [id number]  HPI Doing well on sertraline. No SE. Tolerating well and feels it is working well.   Seeing chiropractor once a month now and doing well.  Feels much improved compared to the beginning of the year.   Plans on starting nutrisystem for weight loss.   Left jaw has been poppoing and cracking occ. Start a month or so ago. No pain right now. No difficulty chewin. Hasn't had to take pin reliever.  Review of Systems     Objective:   Physical Exam  Constitutional: She is oriented to person, place, and time. She appears well-developed and well-nourished.  HENT:  Head: Atraumatic.  Right Ear: External ear normal.  Left Ear: External ear normal.  Mouth/Throat: Oropharynx is clear and moist.       TMS are clear bilat. No popping over the TMJs. No dislocation or masses.   Eyes: Conjunctivae are normal. Pupils are equal, round, and reactive to light.  Neck: Neck supple. No thyromegaly present.  Cardiovascular: Normal rate, regular rhythm and normal heart sounds.   Pulmonary/Chest: Effort normal and breath sounds normal.  Musculoskeletal: She exhibits no edema.  Lymphadenopathy:    She has no cervical adenopathy.  Neurological: She is alert and oriented to person, place, and time.  Skin: Skin is warm and dry.  Psychiatric: She has a normal mood and affect. Her behavior is normal.          Assessment & Plan:  Depression - At goal. F/U in 6 mo. PHQ-9 score of 4. Continue sertraline.   Fibromyalgia - Really doing very well. Continue chiropractic care and flexeril as needed.   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 We discussed avoiding chewing gum. Also checking to make sure she's not clinching her teeth during the day. Working on gentle stretches. She can also take an anti-inflammatory if needed. If pain or discomfort persists and she really needs to see her dentist to make  sure that she's not grinding her teeth at night and might benefit from a mouth guard.  Obesity-we discussed the importance of weight loss. She really needs to lose about 40-50 pounds. She agrees and hopefully when I see her back in 6 months she'll have some significant improvement. Her mother her the benefits of less joint pain and that her body image, as well as overall cardiac health.  She is also overdue to have lipids checked. She was given a lab slip today.

## 2011-03-11 NOTE — Patient Instructions (Signed)
Keep up the good work and make sure to incorporate  Exercise into your weight loss regimen.  We will call you with your lab results. If you don't here from [name] in about a week then please give [name] a call at [phone].

## 2011-03-13 LAB — LIPID PANEL
Cholesterol: 291 mg/dL — ABNORMAL HIGH (ref 0–200)
Total CHOL/HDL Ratio: 5.1 Ratio

## 2011-03-14 LAB — COMPLETE METABOLIC PANEL WITH GFR
Albumin: 4.6 g/dL (ref 3.5–5.2)
Alkaline Phosphatase: 59 U/L (ref 39–117)
BUN: 19 mg/dL (ref 6–23)
CO2: 28 mEq/L (ref 19–32)
Calcium: 9.9 mg/dL (ref 8.4–10.5)
Chloride: 103 mEq/L (ref 96–112)
GFR, Est African American: 72 mL/min — ABNORMAL LOW (ref >89)
GFR, Est Non African American: 62 mL/min — ABNORMAL LOW (ref >89)
Glucose, Bld: 99 mg/dL (ref 70–99)
Potassium: 5.2 mEq/L (ref 3.5–5.3)
Sodium: 139 mEq/L (ref 135–145)
Total Protein: 6.8 g/dL (ref 6.0–8.3)

## 2011-03-20 ENCOUNTER — Telehealth: Payer: Self-pay | Admitting: *Deleted

## 2011-03-20 NOTE — Telephone Encounter (Signed)
Pt calls and states that she would like to hold on taking cholesterol med right now. Her and her husband just started Nutrisystem and would like to see if this will help before starting the med.

## 2011-06-16 ENCOUNTER — Other Ambulatory Visit: Payer: Self-pay | Admitting: Family Medicine

## 2011-09-09 ENCOUNTER — Ambulatory Visit: Payer: No Typology Code available for payment source | Admitting: Family Medicine

## 2011-09-09 DIAGNOSIS — Z0289 Encounter for other administrative examinations: Secondary | ICD-10-CM

## 2011-10-12 ENCOUNTER — Encounter: Payer: Self-pay | Admitting: Family Medicine

## 2011-10-12 ENCOUNTER — Ambulatory Visit (INDEPENDENT_AMBULATORY_CARE_PROVIDER_SITE_OTHER): Payer: No Typology Code available for payment source | Admitting: Family Medicine

## 2011-10-12 VITALS — BP 113/64 | HR 70 | Temp 98.4°F | Ht 65.5 in | Wt 188.0 lb

## 2011-10-12 DIAGNOSIS — E559 Vitamin D deficiency, unspecified: Secondary | ICD-10-CM

## 2011-10-12 DIAGNOSIS — J309 Allergic rhinitis, unspecified: Secondary | ICD-10-CM

## 2011-10-12 DIAGNOSIS — E785 Hyperlipidemia, unspecified: Secondary | ICD-10-CM

## 2011-10-12 DIAGNOSIS — R0789 Other chest pain: Secondary | ICD-10-CM

## 2011-10-12 MED ORDER — SERTRALINE HCL 100 MG PO TABS: 100.0000 mg | ORAL_TABLET | Freq: Every day | ORAL | Status: DC

## 2011-10-12 MED ORDER — CYCLOBENZAPRINE HCL 5 MG PO TABS: 5.0000 mg | ORAL_TABLET | Freq: Every evening | ORAL | Status: DC | PRN

## 2011-10-12 MED ORDER — FLUTICASONE PROPIONATE 50 MCG/ACT NA SUSP: 2.0000 | Freq: Every day | NASAL | Status: DC

## 2011-10-12 NOTE — Progress Notes (Signed)
Subjective:    Patient ID: Carolyn [name], female    DOB: 06-19-1955, 56 y.o.   MRN: [id number]  HPI 9 days of nasal drainage and congesteion.  Then had some blood  In the tissue and going down the back of the throat.  No fever. No ST or ear pain.  No cough.  +sneezing.  No facial pain or pressure.  Maybe some better today  Taking OTC claritin - helps some.  Taking it for about a weeek.   Will get chest Pain when she is really exhausted and lays down.  Says it will last a couple of minutes at the most.  Says only happens when really exhausted. Has happened most of her adult life.  It has not gotten worse lately. She says sometimes she can make it go away more quickly by changing position. She never takes medications for it. No trauma to the chest.  Hyperlipidemia - Due to recheck chol.  Was due about 2 months ago.  Missed her followup appointment.   Review of Systems BP 113/64  Pulse 70  Temp(Src) 98.4 F (36.9 C) (Oral)  Ht 5' 5.5" (1.664 m)  Wt 188 lb (85.276 kg)  BMI 30.81 kg/m2  SpO2 96%    No Known Allergies  No past medical history on file.  Past Surgical History  Procedure Date  . Tonsillectomy 15  . Abdominal hysterectomy 10/90    has her left ovary    History   Social History  . Marital Status: Married    Spouse Name: N/A    Number of Children: N/A  . Years of Education: N/A   Occupational History  . Not on file.   Social History Main Topics  . Smoking status: Never Smoker   . Smokeless tobacco: Not on file  . Alcohol Use: No  . Drug Use: No  . Sexually Active:    Other Topics Concern  . Not on file   Social History Narrative  . No narrative on file    Family History  Problem Relation Age of Onset  . Cancer Other     breast  . Diabetes Father   . Heart attack Brother     Outpatient Encounter Prescriptions as of 10/12/2011  Medication Sig Dispense Refill  . cyclobenzaprine (FLEXERIL) 5 MG tablet Take 1 tablet (5 mg total) by mouth at bedtime  as needed for muscle spasms.  90 tablet  0  . estradiol (VIVELLE-DOT) 0.05 MG/24HR Place 1 patch onto the skin as directed. Change patch twice a week       . sertraline (ZOLOFT) 100 MG tablet Take 1 tablet (100 mg total) by mouth daily.  90 tablet  1  . DISCONTD: FLEXERIL 5 MG tablet TAKE 1 TABLET AS DIRECTED 1/2-1 AT BEDTIME  30 each  3  . DISCONTD: ZOLOFT 100 MG tablet TAKE 1 TABLET DAILY  30 each  1  . fluticasone (FLONASE) 50 MCG/ACT nasal spray Place 2 sprays into the nose daily.  16 g  12          Objective:   Physical Exam  Constitutional: She is oriented to person, place, and time. She appears well-developed and well-nourished.  HENT:  Head: Normocephalic and atraumatic.  Right Ear: External ear normal.  Left Ear: External ear normal.  Nose: Nose normal.  Mouth/Throat: Oropharynx is clear and moist.       TMs and canals are clear.   Eyes: Conjunctivae and EOM are normal. Pupils are equal, round,  and reactive to light.  Neck: Neck supple. No thyromegaly present.  Cardiovascular: Normal rate, regular rhythm and normal heart sounds.        Radial 2+. No carotid bruits.   Pulmonary/Chest: Effort normal and breath sounds normal. She has no wheezes.  Musculoskeletal: She exhibits no edema.  Lymphadenopathy:    She has no cervical adenopathy.  Neurological: She is alert and oriented to person, place, and time.  Skin: Skin is warm and dry.  Psychiatric: She has a normal mood and affect. Her behavior is normal.          Assessment & Plan:  Hyperlipidemia-  Need to recheck her levels. Given lab slip. Will go when fasting.    AR- likely AR related. She does improved with claritin. Will add flonase. Call if not sig better in one week. Consider treatment for acute sinusitis if she's not better at that time.  Atypical chest pain - EK G today shows rate of 57 beats per minute, normal sinus bradycardia with normal axis. Low voltage QRS in the lateral leads. Will check electrolytes  today. This is been going on this for adult life. It sounds like it may be more positional to lying down. Make additional pressure on the sternoclavicular joints or be a little bit of costochondritis.  Vitamin D deficiency-due to recheck her levels today.

## 2011-10-12 NOTE — Patient Instructions (Addendum)
We will call you with your lab results. If you don't here from [name] in about a week then please give [name] a call at [phone]. Allergies, Generic Allergies may happen from anything your body is sensitive to. This may be food, medicines, pollens, chemicals, and nearly anything around you in everyday life that produces allergens. An allergen is anything that causes an allergy producing substance. Heredity is often a factor in causing these problems. This means you may have some of the same allergies as your parents. Food allergies happen in all age groups. Food allergies are some of the most severe and life threatening. Some common food allergies are cow's milk, seafood, eggs, nuts, wheat, and soybeans. SYMPTOMS    Swelling around the mouth.   An itchy red rash or hives.   Vomiting or diarrhea.   Difficulty breathing.  SEVERE ALLERGIC REACTIONS ARE LIFE-THREATENING. This reaction is called anaphylaxis. It can cause the mouth and throat to swell and cause difficulty with breathing and swallowing. In severe reactions only a trace amount of food (for example, peanut oil in a salad) may cause death within seconds. Seasonal allergies occur in all age groups. These are seasonal because they usually occur during the same season every year. They may be a reaction to molds, grass pollens, or tree pollens. Other causes of problems are house dust mite allergens, pet dander, and mold spores. The symptoms often consist of nasal congestion, a runny itchy nose associated with sneezing, and tearing itchy eyes. There is often an associated itching of the mouth and ears. The problems happen when you come in contact with pollens and other allergens. Allergens are the particles in the air that the body reacts to with an allergic reaction. This causes you to release allergic antibodies. Through a chain of events, these eventually cause you to release histamine into the blood stream. Although it is meant to be protective to the  body, it is this release that causes your discomfort. This is why you were given anti-histamines to feel better.  If you are unable to pinpoint the offending allergen, it may be determined by skin or blood testing. Allergies cannot be cured but can be controlled with medicine. Hay fever is a collection of all or some of the seasonal allergy problems. It may often be treated with simple over-the-counter medicine such as diphenhydramine. Take medicine as directed. Do not drink alcohol or drive while taking this medicine. Check with your caregiver or package insert for child dosages. If these medicines are not effective, there are many new medicines your caregiver can prescribe. Stronger medicine such as nasal spray, eye drops, and corticosteroids may be used if the first things you try do not work well. Other treatments such as immunotherapy or desensitizing injections can be used if all else fails. Follow up with your caregiver if problems continue. These seasonal allergies are usually not life threatening. They are generally more of a nuisance that can often be handled using medicine. HOME CARE INSTRUCTIONS    If unsure what causes a reaction, keep a diary of foods eaten and symptoms that follow. Avoid foods that cause reactions.   If hives or rash are present:   Take medicine as directed.   You may use an over-the-counter antihistamine (diphenhydramine) for hives and itching as needed.   Apply cold compresses (cloths) to the skin or take baths in cool water. Avoid hot baths or showers. Heat will make a rash and itching worse.   If you are severely  allergic:   Following a treatment for a severe reaction, hospitalization is often required for closer follow-up.   Wear a medic-alert bracelet or necklace stating the allergy.   You and your family must learn how to give adrenaline or use an anaphylaxis kit.   If you have had a severe reaction, always carry your anaphylaxis kit or EpiPen with you.  Use this medicine as directed by your caregiver if a severe reaction is occurring. Failure to do so could have a fatal outcome.  SEEK MEDICAL CARE IF:  You suspect a food allergy. Symptoms generally happen within 30 minutes of eating a food.   Your symptoms have not gone away within 2 days or are getting worse.   You develop new symptoms.   You want to retest yourself or your child with a food or drink you think causes an allergic reaction. Never do this if an anaphylactic reaction to that food or drink has happened before. Only do this under the care of a caregiver.  SEEK IMMEDIATE MEDICAL CARE IF:    You have difficulty breathing, are wheezing, or have a tight feeling in your chest or throat.   You have a swollen mouth, or you have hives, swelling, or itching all over your body.   You have had a severe reaction that has responded to your anaphylaxis kit or an EpiPen. These reactions may return when the medicine has worn off. These reactions should be considered life threatening.  MAKE SURE YOU:    Understand these instructions.   Will watch your condition.   Will get help right away if you are not doing well or get worse.  Document Released: 07/14/2002 Document Revised: 04/09/2011 Document Reviewed: 12/19/2007 [facility] Patient Information 2012 [place], [place].

## 2011-10-13 LAB — LIPID PANEL
Cholesterol: 272 mg/dL — ABNORMAL HIGH (ref 0–200)
HDL: 49 mg/dL (ref >39)
LDL Cholesterol: 186 mg/dL — ABNORMAL HIGH (ref 0–99)
Total CHOL/HDL Ratio: 5.6 Ratio
Triglycerides: 185 mg/dL — ABNORMAL HIGH (ref <150)
VLDL: 37 mg/dL (ref 0–40)

## 2011-10-13 LAB — BASIC METABOLIC PANEL
BUN: 17 mg/dL (ref 6–23)
Chloride: 105 mEq/L (ref 96–112)
Glucose, Bld: 90 mg/dL (ref 70–99)
Potassium: 4.9 mEq/L (ref 3.5–5.3)
Sodium: 140 mEq/L (ref 135–145)

## 2011-10-14 ENCOUNTER — Other Ambulatory Visit: Payer: Self-pay | Admitting: Family Medicine

## 2011-10-14 ENCOUNTER — Encounter: Payer: Self-pay | Admitting: *Deleted

## 2011-10-14 MED ORDER — PRAVASTATIN SODIUM 40 MG PO TABS: 40.0000 mg | ORAL_TABLET | Freq: Every day | ORAL | Status: DC

## 2011-12-10 ENCOUNTER — Ambulatory Visit (INDEPENDENT_AMBULATORY_CARE_PROVIDER_SITE_OTHER): Payer: No Typology Code available for payment source | Admitting: Family Medicine

## 2011-12-10 ENCOUNTER — Encounter: Payer: Self-pay | Admitting: Family Medicine

## 2011-12-10 VITALS — BP 119/75 | HR 72 | Ht 65.0 in | Wt 192.0 lb

## 2011-12-10 DIAGNOSIS — E785 Hyperlipidemia, unspecified: Secondary | ICD-10-CM

## 2011-12-10 DIAGNOSIS — E6609 Other obesity due to excess calories: Secondary | ICD-10-CM | POA: Insufficient documentation

## 2011-12-10 DIAGNOSIS — E669 Obesity, unspecified: Secondary | ICD-10-CM

## 2011-12-10 NOTE — Progress Notes (Addendum)
  Subjective:    Patient ID: Carolyn [name], female    DOB: [birth year], 56 y.o.   MRN: [id number]  HPI Here to followup on cholesterol. Overall she's doing well. We repeated her cholesterol in June. She started pravastatin 40 mg. She's tolerating it well without any side effects or myalgias. She has been trying to get more exercise and eat more healthy. In fact she was able to decrease about 9 points on her own. She's here today to followup.   Review of Systems     Objective:   Physical Exam  Constitutional: She is oriented to person, place, and time. She appears well-developed and well-nourished.  HENT:  Head: Normocephalic and atraumatic.  Cardiovascular: Normal rate, regular rhythm and normal heart sounds.   Pulmonary/Chest: Effort normal and breath sounds normal.  Neurological: She is alert and oriented to person, place, and time.  Skin: Skin is warm and dry.  Psychiatric: She has a normal mood and affect. Her behavior is normal.          Assessment & Plan:  Hyperlipidemia-she's doing very well the pravastatin. We did discuss potential side effects to monitor for. We will go ahead and check liver function as well as fasting lipid panel. We will call her with results. Otherwise followup in 6 months.  Overweight - continue to work on exercise and diet.

## 2011-12-10 NOTE — Addendum Note (Signed)
Addended by: Nani Gasser D on: 12/10/2011 01:31 PM   Modules accepted: Level of Service

## 2011-12-15 LAB — COMPLETE METABOLIC PANEL WITH GFR
Albumin: 4.4 g/dL (ref 3.5–5.2)
Alkaline Phosphatase: 56 U/L (ref 39–117)
BUN: 18 mg/dL (ref 6–23)
CO2: 29 mEq/L (ref 19–32)
GFR, Est African American: 75 mL/min
GFR, Est Non African American: 65 mL/min
Glucose, Bld: 91 mg/dL (ref 70–99)
Potassium: 4.9 mEq/L (ref 3.5–5.3)
Sodium: 138 mEq/L (ref 135–145)
Total Bilirubin: 0.4 mg/dL (ref 0.3–1.2)
Total Protein: 6.9 g/dL (ref 6.0–8.3)

## 2011-12-15 LAB — LIPID PANEL: Cholesterol: 202 mg/dL — ABNORMAL HIGH (ref 0–200)

## 2011-12-16 ENCOUNTER — Other Ambulatory Visit: Payer: Self-pay | Admitting: *Deleted

## 2011-12-16 MED ORDER — PRAVASTATIN SODIUM 40 MG PO TABS: 40.0000 mg | ORAL_TABLET | Freq: Every day | ORAL | Status: DC

## 2012-01-26 ENCOUNTER — Other Ambulatory Visit: Payer: Self-pay | Admitting: Family Medicine

## 2012-02-25 ENCOUNTER — Other Ambulatory Visit: Payer: Self-pay | Admitting: Family Medicine

## 2012-02-25 NOTE — Telephone Encounter (Signed)
Last rx'ed 01-26-12 but insur wont disp 90 so ok to fill 30 this time.pharm has on file that she should get 2 refills for total of 90 for three month supply

## 2012-03-07 ENCOUNTER — Other Ambulatory Visit: Payer: Self-pay | Admitting: Family Medicine

## 2012-04-19 ENCOUNTER — Ambulatory Visit: Payer: No Typology Code available for payment source | Admitting: Family Medicine

## 2012-04-29 ENCOUNTER — Telehealth: Payer: Self-pay | Admitting: Family Medicine

## 2012-04-29 NOTE — Telephone Encounter (Signed)
Call pt: [facility] should be contacting her soon about getting additional imaging for her mammogram. If doesn't hear from them somethime this week then please let me know.

## 2012-05-02 ENCOUNTER — Other Ambulatory Visit: Payer: Self-pay | Admitting: Family Medicine

## 2012-05-02 ENCOUNTER — Telehealth: Payer: Self-pay | Admitting: Family Medicine

## 2012-05-02 NOTE — Telephone Encounter (Signed)
Patient advised.

## 2012-05-02 NOTE — Telephone Encounter (Signed)
[facility] needs an order for the additional imaging. They are faxing over a fax for the order.

## 2012-05-23 ENCOUNTER — Encounter: Payer: Self-pay | Admitting: Family Medicine

## 2012-06-13 ENCOUNTER — Encounter: Payer: Self-pay | Admitting: Family Medicine

## 2012-06-13 ENCOUNTER — Ambulatory Visit (INDEPENDENT_AMBULATORY_CARE_PROVIDER_SITE_OTHER): Payer: No Typology Code available for payment source | Admitting: Family Medicine

## 2012-06-13 VITALS — BP 132/77 | HR 82 | Ht 65.0 in | Wt 196.0 lb

## 2012-06-13 DIAGNOSIS — F3289 Other specified depressive episodes: Secondary | ICD-10-CM

## 2012-06-13 DIAGNOSIS — M542 Cervicalgia: Secondary | ICD-10-CM

## 2012-06-13 DIAGNOSIS — E669 Obesity, unspecified: Secondary | ICD-10-CM

## 2012-06-13 DIAGNOSIS — IMO0001 Reserved for inherently not codable concepts without codable children: Secondary | ICD-10-CM

## 2012-06-13 DIAGNOSIS — F329 Major depressive disorder, single episode, unspecified: Secondary | ICD-10-CM

## 2012-06-13 DIAGNOSIS — F32A Depression, unspecified: Secondary | ICD-10-CM

## 2012-06-13 MED ORDER — PRAVASTATIN SODIUM 40 MG PO TABS: 40.0000 mg | ORAL_TABLET | Freq: Every day | ORAL | Status: DC

## 2012-06-13 MED ORDER — DULOXETINE HCL 30 MG PO CPEP: ORAL_CAPSULE | ORAL | Status: DC

## 2012-06-13 MED ORDER — CYCLOBENZAPRINE HCL 10 MG PO TABS: ORAL_TABLET | ORAL | Status: DC

## 2012-06-13 MED ORDER — DICLOFENAC SODIUM 75 MG PO TBEC: 75.0000 mg | DELAYED_RELEASE_TABLET | Freq: Two times a day (BID) | ORAL | Status: DC | PRN

## 2012-06-13 MED ORDER — DICLOFENAC SODIUM 1 % TD GEL: 2.0000 g | Freq: Four times a day (QID) | TRANSDERMAL | Status: DC

## 2012-06-13 NOTE — Progress Notes (Signed)
  Subjective:    Patient ID: Carolyn [name], female    DOB: 12-Dec-1955, 57 y.o.   MRN: [id number]  HPI Depression - feels that its due to being in more pain, pt would like to poss. try voltaren again since it helped in the past. Has been doing accupunture for her pain.  Will be sore and achy as well the next day but does feel it is helping. She would like to try the volteran gel again. She is having a lot of pain in her neck.  Say the 5mg  of flexeril is not helping as much as it used to.  She feels the pain is aggrevating her mood.  Then feels less productive. Has tried deep tissue massage.     Review of Systems     Objective:   Physical Exam  Constitutional: She is oriented to person, place, and time. She appears well-developed and well-nourished.  HENT:  Head: Normocephalic and atraumatic.  Eyes: Conjunctivae and EOM are normal.  Cardiovascular: Normal rate, regular rhythm and normal heart sounds.   Pulmonary/Chest: Effort normal and breath sounds normal.  Neurological: She is alert and oriented to person, place, and time.  Skin: Skin is warm and dry. No pallor.  Psychiatric: She has a normal mood and affect. Her behavior is normal.          Assessment & Plan:  Depression - PHQ- 9 score of 6 (mild).  We discussed diff options. Will change to cymbalta to cover her depression and ot help with her back pain. Taper given.  Will start new med.  I didn't have samples but booklet given.   Neck and upper back pain/spasm - we can inc flexeril to 10mg  daily.  Continue accupunture and massage as well. Will refill her diclofenac prn.  She wants to change to diclofenac gel.  alsos consider PT if needed. Work on using heated rice pack in the evenings once home.

## 2012-06-13 NOTE — Patient Instructions (Signed)
Please decrease your zoloft to half a tab daily for 10 days, then quarter tab daily for one week and then stop.   Can start the cymbalta in 2 weeks.

## 2012-06-13 NOTE — Addendum Note (Signed)
Addended by: Nani Gasser D on: 06/13/2012 05:41 PM   Modules accepted: Orders

## 2012-08-09 ENCOUNTER — Other Ambulatory Visit: Payer: Self-pay | Admitting: Family Medicine

## 2012-10-06 ENCOUNTER — Encounter: Payer: Self-pay | Admitting: Family Medicine

## 2012-10-06 ENCOUNTER — Ambulatory Visit (INDEPENDENT_AMBULATORY_CARE_PROVIDER_SITE_OTHER): Payer: No Typology Code available for payment source | Admitting: Family Medicine

## 2012-10-06 VITALS — BP 126/82 | HR 89 | Temp 98.2°F | Wt 193.0 lb

## 2012-10-06 DIAGNOSIS — R197 Diarrhea, unspecified: Secondary | ICD-10-CM

## 2012-10-06 DIAGNOSIS — R509 Fever, unspecified: Secondary | ICD-10-CM

## 2012-10-06 DIAGNOSIS — F341 Dysthymic disorder: Secondary | ICD-10-CM

## 2012-10-06 DIAGNOSIS — R110 Nausea: Secondary | ICD-10-CM

## 2012-10-06 DIAGNOSIS — F418 Other specified anxiety disorders: Secondary | ICD-10-CM

## 2012-10-06 MED ORDER — ONDANSETRON HCL 4 MG PO TABS: 4.0000 mg | ORAL_TABLET | Freq: Three times a day (TID) | ORAL | Status: DC | PRN

## 2012-10-06 MED ORDER — SERTRALINE HCL 50 MG PO TABS: 50.0000 mg | ORAL_TABLET | Freq: Every day | ORAL | Status: DC

## 2012-10-06 NOTE — Progress Notes (Signed)
  Subjective:    Patient ID: Carolyn [name], female    DOB: 05-05-1955, 57 y.o.   MRN: [id number]  HPI 4 days pt stated that this started early monday morning she went to work for about an 1 1/2 and had to go home stilll is not feeling well and will need a note for work . Has had fever and chills, nausea and diarrhea, and body aches. No URI sxs.  Vomited 2 days ago but not yesterday and today.  Taking align to help.  Starting to eat today.  No camping or travel outside the coun[place].  No sick contacts.  No sig abdominal pain.  Has been sipping on liquids.  She is clammy and misserable. No Cough or SOB.   Need refill  on her cymbalta. We started her on Cymbalta back in January. She says she ran out of refills and Cipro taking it for quite some time. Her husband evidently was on the same medication at some point in time they were able to find his bottle. So she started taking his medication. She says it was the 60 mg dose. She says she's not sure she really wants to stay on it because of cost and she thinks it will require prior authorization with her insurance. She think she might just want to go back to her old sertraline which is what she was on previously.  She is under a lot of stress. 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 She says she still works full-time and when she does take her husband to doctor's appointments she'll actually worked overtime and eating stomach upper time. She just feels overwhelmed and stressed out and wonders if it may have compromised her immune system and made it more difficult for her to bounce back from his current illness.  Review of Systems     Objective:   Physical Exam  Constitutional: She is oriented to person, place, and time. She appears well-developed and well-nourished.  HENT:  Head: Normocephalic and atraumatic.  Eyes: Conjunctivae are normal. Pupils are equal, round, and reactive to light.  Neck: Neck supple. No thyromegaly  present.  Cardiovascular: Normal rate, regular rhythm and normal heart sounds.   Pulmonary/Chest: Effort normal and breath sounds normal.  Abdominal: Soft. Bowel sounds are normal. She exhibits no distension and no mass. There is tenderness. There is no rebound and no guarding.  Tender in the LLQ.   Musculoskeletal: She exhibits no edema.  Lymphadenopathy:    She has no cervical adenopathy.  Neurological: She is alert and oriented to person, place, and time.  Skin: Skin is warm.  Skin is clammy  Psychiatric: She has a normal mood and affect. Her behavior is normal.          Assessment & Plan:  Diarrhea and fever - could be viral but has been 4 days at this point which is unusall. Will check CBC. zofran given. Will check UA. Check electrolytes.  Will consider CT abd and CXR if WBC is elevated.    Depression/anxiety- I let her when she was given the Cymbalta she was actually supposed to followup in 6 weeks which is why he did not have additional refills on it. I'll be happy switch her to sertraline at this point in time. She started off of the Cymbalta for at least a week. I will restart her old dose. She is to follow up in one month for mood and anxiety.

## 2012-10-07 LAB — COMPLETE METABOLIC PANEL WITH GFR
Albumin: 4.2 g/dL (ref 3.5–5.2)
Alkaline Phosphatase: 67 U/L (ref 39–117)
BUN: 17 mg/dL (ref 6–23)
Calcium: 10 mg/dL (ref 8.4–10.5)
Chloride: 101 mEq/L (ref 96–112)
GFR, Est Non African American: 77 mL/min
Glucose, Bld: 96 mg/dL (ref 70–99)
Potassium: 3.5 mEq/L (ref 3.5–5.3)

## 2012-10-07 LAB — CBC WITH DIFFERENTIAL/PLATELET
Basophils Relative: 1 % (ref 0–1)
HCT: 42.5 % (ref 36.0–46.0)
Hemoglobin: 14.9 g/dL (ref 12.0–15.0)
MCHC: 35.1 g/dL (ref 30.0–36.0)
MCV: 85.7 fL (ref 78.0–100.0)
Monocytes Absolute: 0.9 10*3/uL (ref 0.1–1.0)
Monocytes Relative: 12 % (ref 3–12)
Neutro Abs: 3.7 10*3/uL (ref 1.7–7.7)

## 2012-10-07 LAB — URINALYSIS
Protein, ur: NEGATIVE mg/dL
Urobilinogen, UA: 1 mg/dL (ref 0.0–1.0)

## 2012-10-12 ENCOUNTER — Other Ambulatory Visit: Payer: Self-pay | Admitting: Family Medicine

## 2012-10-12 DIAGNOSIS — R112 Nausea with vomiting, unspecified: Secondary | ICD-10-CM

## 2012-10-12 DIAGNOSIS — R197 Diarrhea, unspecified: Secondary | ICD-10-CM

## 2012-10-12 DIAGNOSIS — R509 Fever, unspecified: Secondary | ICD-10-CM

## 2012-10-13 ENCOUNTER — Telehealth: Payer: Self-pay | Admitting: *Deleted

## 2012-10-13 ENCOUNTER — Ambulatory Visit (INDEPENDENT_AMBULATORY_CARE_PROVIDER_SITE_OTHER): Payer: No Typology Code available for payment source

## 2012-10-13 DIAGNOSIS — R112 Nausea with vomiting, unspecified: Secondary | ICD-10-CM

## 2012-10-13 DIAGNOSIS — R197 Diarrhea, unspecified: Secondary | ICD-10-CM

## 2012-10-13 DIAGNOSIS — R509 Fever, unspecified: Secondary | ICD-10-CM

## 2012-10-13 MED ORDER — IOHEXOL 300 MG/ML  SOLN
100.0000 mL | Freq: Once | INTRAMUSCULAR | Status: AC | PRN
  Administered 2012-10-13: 100 mL via INTRAVENOUS

## 2012-10-13 NOTE — Telephone Encounter (Signed)
Per [facility] no precert needed for CT Abd/Pelvis with contrast when done outpt. Caroline notified. Barry Dienes, LPN

## 2012-11-03 ENCOUNTER — Ambulatory Visit (INDEPENDENT_AMBULATORY_CARE_PROVIDER_SITE_OTHER): Payer: No Typology Code available for payment source | Admitting: Family Medicine

## 2012-11-03 ENCOUNTER — Encounter: Payer: Self-pay | Admitting: Family Medicine

## 2012-11-03 VITALS — BP 114/71 | HR 84 | Wt 186.0 lb

## 2012-11-03 DIAGNOSIS — F341 Dysthymic disorder: Secondary | ICD-10-CM

## 2012-11-03 DIAGNOSIS — F418 Other specified anxiety disorders: Secondary | ICD-10-CM

## 2012-11-03 DIAGNOSIS — E785 Hyperlipidemia, unspecified: Secondary | ICD-10-CM

## 2012-11-03 MED ORDER — SERTRALINE HCL 100 MG PO TABS: 100.0000 mg | ORAL_TABLET | Freq: Every day | ORAL | Status: DC

## 2012-11-03 NOTE — Progress Notes (Signed)
  Subjective:    Patient ID: Carolyn [name], female    DOB: 03-01-1956, 57 y.o.   MRN: [id number]  HPI  Depression and anxiety - Still struggling with sleep, fatigue, worrying and trouble relaxing. She would like to increase her zoloft to 100mg .  She has already been taking 2 of the 50mg  tabs. No HA, CP, or SOB.   Had accupuncture from her neck and shoulders.  That has helped her stress and tension.   Hyperlipidemia - Tolerating statin well with no myalgias or S.E.    Review of Systems     Objective:   Physical Exam  Constitutional: She is oriented to person, place, and time. She appears well-developed and well-nourished.  HENT:  Head: Normocephalic and atraumatic.  Cardiovascular: Normal rate, regular rhythm and normal heart sounds.   Pulmonary/Chest: Effort normal and breath sounds normal.  Neurological: She is alert and oriented to person, place, and time.  Skin: Skin is warm and dry.  Psychiatric: She has a normal mood and affect. Her behavior is normal.          Assessment & Plan:  Anxiety/Depression - PHQ-9 score of 2 and GAD-7 score of 2. Will change dose to 100mg . F/U 2-3 months. Has had a hard time getting back to work.    Hyperlipidemia - Due for labs in August I went ahead and gave her a lab slip to go in the next couple of months. Continue statin. Will check liver enzymes as well.

## 2012-12-03 ENCOUNTER — Other Ambulatory Visit: Payer: Self-pay | Admitting: Family Medicine

## 2012-12-14 ENCOUNTER — Other Ambulatory Visit: Payer: Self-pay | Admitting: Family Medicine

## 2013-01-25 ENCOUNTER — Telehealth: Payer: Self-pay | Admitting: *Deleted

## 2013-01-25 NOTE — Telephone Encounter (Signed)
Called and lvm asking pt if she wanted to schedule a f/u appt for further testing at the [facility] center.[name] [place]

## 2013-02-02 LAB — COMPLETE METABOLIC PANEL WITH GFR
Alkaline Phosphatase: 49 U/L (ref 39–117)
BUN: 18 mg/dL (ref 6–23)
GFR, Est Non African American: 71 mL/min
Glucose, Bld: 86 mg/dL (ref 70–99)
Total Bilirubin: 0.4 mg/dL (ref 0.3–1.2)

## 2013-02-02 LAB — LIPID PANEL
Cholesterol: 236 mg/dL — ABNORMAL HIGH (ref 0–200)
HDL: 54 mg/dL (ref >39)
Total CHOL/HDL Ratio: 4.4 Ratio

## 2013-02-03 ENCOUNTER — Ambulatory Visit (INDEPENDENT_AMBULATORY_CARE_PROVIDER_SITE_OTHER): Payer: No Typology Code available for payment source | Admitting: Family Medicine

## 2013-02-03 ENCOUNTER — Encounter: Payer: Self-pay | Admitting: Family Medicine

## 2013-02-03 VITALS — BP 114/72 | HR 79 | Wt 177.0 lb

## 2013-02-03 DIAGNOSIS — F341 Dysthymic disorder: Secondary | ICD-10-CM

## 2013-02-03 DIAGNOSIS — Z6829 Body mass index (BMI) 29.0-29.9, adult: Secondary | ICD-10-CM

## 2013-02-03 DIAGNOSIS — F329 Major depressive disorder, single episode, unspecified: Secondary | ICD-10-CM

## 2013-02-03 DIAGNOSIS — E785 Hyperlipidemia, unspecified: Secondary | ICD-10-CM

## 2013-02-03 DIAGNOSIS — Z23 Encounter for immunization: Secondary | ICD-10-CM

## 2013-02-03 DIAGNOSIS — F32A Depression, unspecified: Secondary | ICD-10-CM

## 2013-02-03 DIAGNOSIS — M797 Fibromyalgia: Secondary | ICD-10-CM

## 2013-02-03 DIAGNOSIS — IMO0001 Reserved for inherently not codable concepts without codable children: Secondary | ICD-10-CM

## 2013-02-03 MED ORDER — SERTRALINE HCL 100 MG PO TABS: 100.0000 mg | ORAL_TABLET | Freq: Every day | ORAL | Status: DC

## 2013-02-03 MED ORDER — CYCLOBENZAPRINE HCL 5 MG PO TABS: 5.0000 mg | ORAL_TABLET | Freq: Every evening | ORAL | Status: DC | PRN

## 2013-02-03 NOTE — Progress Notes (Signed)
  Subjective:    Patient ID: Carolyn [name], female    DOB: 1956-01-15, 57 y.o.   MRN: [id number]  HPI Anxiety/Depression - Inc sertraline to 100mg  QD.  Happy with her current regimen. She feels like it's working very well. She's not had any negative side effects. She would like refills today.  Hyperlipidemi - reviewed labs with her today. Stopped her pravastatin about 2 months ago and has tried to change her diet over the last month instead of taking a medication.   Obesity - has lost about 20 lbs in the last 8 months.  She's been really watching her diet. No regular exercise yet but plans on starting an exercise regimen. She reports she has a lot of problems with her feet.  Fibromyalgia-overall she's doing really well. She felt like the 10 mg Flexeril was too strong. We bumped up from 5 mg because she felt fine was not quite adequate. She takes a 10 mg she feels a little too groggy.Marland Kitchen She wants and if we can switch back to 5 mg tabs so that she can more flexible he does that. That way she can take 1, 1-1/2, or 2 tabs as needed .Review of Systems     Objective:   Physical Exam  Constitutional: She is oriented to person, place, and time. She appears well-developed and well-nourished.  HENT:  Head: Normocephalic and atraumatic.  Neurological: She is alert and oriented to person, place, and time.  Skin: Skin is warm and dry.  Psychiatric: She has a normal mood and affect. Her behavior is normal.          Assessment & Plan:  Anxiety/Depression -  continue with current regimen. We'll continue sertraline 100 mg. F/U in 3-4 months.  PHQ 9 score of 2, gad 7 score of 2 today. Well controlled.  Hyperlipidemia - Will give her 4-6 months to work on diet and exercise and then recheck.  If not at goal will add the pravastatin.    Obesity-overall doing well. She's often as 20 pounds in the last 8 months which is fantastic. Continue diet and exercise program.  Fibromyalgia-doing well overall.  We'll switch the Flexeril 5 mg for more flexibility. Followup in 3-4 months to make sure that she's still doing well. Hopefully she will get to the winter without any significant flares. She is planning on starting an exercise routine which is fantastic. If not at goal will add the pravastatin.

## 2013-05-15 ENCOUNTER — Ambulatory Visit (INDEPENDENT_AMBULATORY_CARE_PROVIDER_SITE_OTHER): Payer: No Typology Code available for payment source | Admitting: Family Medicine

## 2013-05-15 ENCOUNTER — Encounter: Payer: Self-pay | Admitting: Family Medicine

## 2013-05-15 VITALS — BP 116/71 | HR 66 | Temp 97.9°F | Ht 65.0 in | Wt 186.0 lb

## 2013-05-15 DIAGNOSIS — E782 Mixed hyperlipidemia: Secondary | ICD-10-CM | POA: Insufficient documentation

## 2013-05-15 DIAGNOSIS — F341 Dysthymic disorder: Secondary | ICD-10-CM

## 2013-05-15 DIAGNOSIS — E785 Hyperlipidemia, unspecified: Secondary | ICD-10-CM

## 2013-05-15 DIAGNOSIS — E669 Obesity, unspecified: Secondary | ICD-10-CM

## 2013-05-15 MED ORDER — DICLOFENAC SODIUM 75 MG PO TBEC: DELAYED_RELEASE_TABLET | ORAL | Status: DC

## 2013-05-15 MED ORDER — PRAVASTATIN SODIUM 40 MG PO TABS: 40.0000 mg | ORAL_TABLET | Freq: Every day | ORAL | Status: DC

## 2013-05-15 MED ORDER — SERTRALINE HCL 100 MG PO TABS: 100.0000 mg | ORAL_TABLET | Freq: Every day | ORAL | Status: DC

## 2013-05-15 NOTE — Progress Notes (Signed)
   Subjective:    Patient ID: Carolyn [name], female    DOB: Feb 02, 1956, 58 y.o.   MRN: [id number]  HPI Depression/Anxiety - Doing well.  Still feeling some nervous several days of the week.  Happy with sertraline 100mg . Sleep is fair.    Hyperlpidemia - doing well on statin. No S.E. Has gained back 10 lbs.   Review of Systems     Objective:   Physical Exam  Constitutional: She is oriented to person, place, and time. She appears well-developed and well-nourished.  HENT:  Head: Normocephalic and atraumatic.  Eyes: Conjunctivae are normal.  Cardiovascular: Normal rate, regular rhythm and normal heart sounds.   Pulmonary/Chest: Effort normal and breath sounds normal.  Neurological: She is alert and oriented to person, place, and time.  Skin: Skin is warm and dry.  Psychiatric: She has a normal mood and affect. Her behavior is normal.          Assessment & Plan:  Depression/Anxiety - GAD- 7 score of 4.  PHQ-7 score of 3.  Well controlled. Continue current regimen. RF sent. F/U in 4-6 months.   Obesity/BMI 30 - has gained about 10 lbs back. She had worked so hard over the summer to lose weight.    Hyperlipidemia - dong well on statin. Get back on track with diet and exercise. F/U in spring for repeat lipids.

## 2013-06-15 ENCOUNTER — Ambulatory Visit: Payer: No Typology Code available for payment source | Admitting: Family Medicine

## 2013-06-21 ENCOUNTER — Ambulatory Visit: Payer: No Typology Code available for payment source | Admitting: Family Medicine

## 2013-07-07 ENCOUNTER — Encounter: Payer: Self-pay | Admitting: Family Medicine

## 2013-07-07 ENCOUNTER — Ambulatory Visit (INDEPENDENT_AMBULATORY_CARE_PROVIDER_SITE_OTHER): Payer: No Typology Code available for payment source | Admitting: Family Medicine

## 2013-07-07 VITALS — BP 122/66 | HR 79 | Temp 98.1°F | Wt 192.0 lb

## 2013-07-07 DIAGNOSIS — F341 Dysthymic disorder: Secondary | ICD-10-CM

## 2013-07-07 DIAGNOSIS — F418 Other specified anxiety disorders: Secondary | ICD-10-CM

## 2013-07-07 NOTE — Progress Notes (Signed)
   Subjective:    Patient ID: Carolyn [name], female    DOB: 1955-07-07, 58 y.o.   MRN: [id number]  HPI Here to followup depression and anxiety today. Still feels a little nervous and on edge several days a week. She complains of overeating and having low energy. Still feels a little down depressed several days. Currently on Zoloft 100 mg.  Feels really overwhelmed at work and at home too. Has gained weight and has been overeating.  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 They underwent a evaluation for staffing. This started in October and just ended. This is been extremely stressful and she feels she is really gotten behind on things at home because of work. Her husband is also very ill and unable to try should she have to take them to appointments.  Review of Systems     Objective:   Physical Exam  Constitutional: She is oriented to person, place, and time. She appears well-developed and well-nourished.  HENT:  Head: Normocephalic and atraumatic.  Eyes: Conjunctivae and EOM are normal.  Cardiovascular: Normal rate.   Pulmonary/Chest: Effort normal.  Neurological: She is alert and oriented to person, place, and time.  Skin: Skin is dry. No pallor.  Psychiatric: She has a normal mood and affect. Her behavior is normal.          Assessment & Plan:  Depression/anxiety-gad 7 score of 3 today and PHQ 9 score of 7. PHQ 9 score is up 4 points from previous. Work on lowering stress levels. Has planned to take some days off so that she can get some things done at home and ketchup.Marland Kitchen. Has been taking naps on the weekend.  Recommended that she try to schedule her time had a time so that she can plan to get things done in addition to giving herself some time to relax. Also encourage some regular exercise to help relieve stress levels. She declined increasing her adjusting her medication today and wants to work on other ways to lower her stress levels. Followup in 2-3  months to make sure that she still doing okay.  Time spent 20 minutes, greater than 50% of the time spent discussing her depression and anxiety.

## 2013-08-25 ENCOUNTER — Other Ambulatory Visit: Payer: Self-pay | Admitting: Family Medicine

## 2013-08-26 ENCOUNTER — Other Ambulatory Visit: Payer: Self-pay | Admitting: Family Medicine

## 2013-10-09 ENCOUNTER — Ambulatory Visit (INDEPENDENT_AMBULATORY_CARE_PROVIDER_SITE_OTHER): Payer: No Typology Code available for payment source | Admitting: Family Medicine

## 2013-10-09 ENCOUNTER — Encounter: Payer: Self-pay | Admitting: Family Medicine

## 2013-10-09 VITALS — BP 148/77 | HR 64 | Wt 200.0 lb

## 2013-10-09 DIAGNOSIS — S90852A Superficial foreign body, left foot, initial encounter: Secondary | ICD-10-CM

## 2013-10-09 DIAGNOSIS — F341 Dysthymic disorder: Secondary | ICD-10-CM

## 2013-10-09 DIAGNOSIS — F418 Other specified anxiety disorders: Secondary | ICD-10-CM

## 2013-10-09 DIAGNOSIS — N393 Stress incontinence (female) (male): Secondary | ICD-10-CM

## 2013-10-09 DIAGNOSIS — IMO0002 Reserved for concepts with insufficient information to code with codable children: Secondary | ICD-10-CM

## 2013-10-09 MED ORDER — DICLOFENAC SODIUM 75 MG PO TBEC: DELAYED_RELEASE_TABLET | ORAL | Status: DC

## 2013-10-09 MED ORDER — CYCLOBENZAPRINE HCL 5 MG PO TABS: ORAL_TABLET | ORAL | Status: DC

## 2013-10-09 MED ORDER — PRAVASTATIN SODIUM 40 MG PO TABS: 40.0000 mg | ORAL_TABLET | Freq: Every day | ORAL | Status: DC

## 2013-10-09 MED ORDER — SOLIFENACIN SUCCINATE 10 MG PO TABS: 10.0000 mg | ORAL_TABLET | Freq: Every day | ORAL | Status: DC

## 2013-10-09 MED ORDER — SERTRALINE HCL 100 MG PO TABS: 150.0000 mg | ORAL_TABLET | Freq: Every day | ORAL | Status: DC

## 2013-10-09 NOTE — Progress Notes (Signed)
   Subjective:    Patient ID: Carolyn [name], female    DOB: 03-23-1956, 58 y.o.   MRN: [id number]  HPI Here for followup depression/anxiety-she was really stressed out the last time I saw her. This is her three-month followup. Work has been really stressful.  Has gained some more weight.  + fatigue and low self -esteem.  Still feeing down several days of the week. Not sleeping well partly bc of her husband health as he is up and down all night.   She feels that there something in her left heel, maybe glass. Happened maybe 3 weeks ago. Bled initially.  She says she's been feeling a sharp pinching sensation in the area.  Urge and stress incontinence- has been having more difficulty with leaking of urine especially with coughing and sneezing and sometimes some urgency symptoms. No dysuria or hematuria fever chills or sweats. She's not interested in any type of interventional or surgical procedure for correction of this.  Review of Systems     Objective:   Physical Exam  Constitutional: She is oriented to person, place, and time. She appears well-developed and well-nourished.  HENT:  Head: Normocephalic and atraumatic.  Eyes: Conjunctivae and EOM are normal.  Cardiovascular: Normal rate.   Pulmonary/Chest: Effort normal.  Neurological: She is alert and oriented to person, place, and time.  Skin: Skin is dry. No pallor.  Psychiatric: She has a normal mood and affect. Her behavior is normal.          Assessment & Plan:  Depression/anxiety- PHQ-9 score of of 8. GAD-7 score 2.  Anxiety is well controlled but depression is not.  Will inc sertraline to 150mg   F/U in 8 weeks.    Left heel pain - discussed option of bringing the wound to see if there is any glass in the wound.  Recommend keep foot elevated today. Use Tylenol as needed for pain relief.   Urge/stress incontinence-discussed different options. We could start with a perception medication. We'll start with VESIcare. Warned about  potential signs and symptoms on the medication. Followup in 2-3 months to see if it's working well. Also consider referral to GYN for further evaluation. She's not interested in any type of procedure at this point time so wants to try medication.  Incision of Heel Wound for Foreign body Procedure Note  Pre-operative Diagnosis: Foreign body  Post-operative Diagnosis: Foreign body, glass found in wound.  Indications: Persistent pain x3 weeks with known injury and bleeding.  Anesthesia: 1% lidocaine with epinephrine  Procedure Details  The procedure, risks and complications have been discussed in detail (including, but not limited to airway compromise, infection, bleeding) with the patient, and the patient has signed consent to the procedure.  The skin was sterilely prepped and draped over the affected area in the usual fashion. After adequate local anesthesia, I&D with a #11 blade was performed on the lesion on her left heel. Purulent drainage: absent. After prepping with forceps I was able to extract a piece of clear glass. Patient tolerated the procedure well. Triple antibiotic and gauze placed over the wound and wrapped with co-ban The patient was observed until stable. Recommend keep foot elevated today. Use Tylenol as needed for pain relief.  Findings: Glass in wound  EBL: 0 cc's  Drains: none.   Condition: Tolerated procedure well   Complications: none.

## 2013-10-28 ENCOUNTER — Other Ambulatory Visit: Payer: Self-pay | Admitting: Family Medicine

## 2013-11-20 ENCOUNTER — Telehealth: Payer: Self-pay | Admitting: *Deleted

## 2013-11-20 MED ORDER — SERTRALINE HCL 100 MG PO TABS: ORAL_TABLET | ORAL | Status: DC

## 2013-11-20 NOTE — Telephone Encounter (Signed)
Pt called and stated the directions on sertraline were incorrect she is to take 150 mg and would like new rx sent.Loralee PacasBarkley, Jaydan Chretien ColwichLynetta

## 2013-12-11 ENCOUNTER — Ambulatory Visit (INDEPENDENT_AMBULATORY_CARE_PROVIDER_SITE_OTHER): Payer: No Typology Code available for payment source | Admitting: Family Medicine

## 2013-12-11 ENCOUNTER — Encounter: Payer: Self-pay | Admitting: Family Medicine

## 2013-12-11 VITALS — BP 113/70 | HR 78 | Wt 200.0 lb

## 2013-12-11 DIAGNOSIS — N39498 Other specified urinary incontinence: Secondary | ICD-10-CM

## 2013-12-11 DIAGNOSIS — F341 Dysthymic disorder: Secondary | ICD-10-CM

## 2013-12-11 NOTE — Progress Notes (Signed)
   Subjective:    Patient ID: Carolyn [name], female    DOB: [birth year], 58 y.o.   MRN: [id number]  HPI Mood disorder-she's currently taking sertraline 150 mg daily. Previous PHQ 9 score was 8. She still has little interest or pleasure in doing things several days a week as well as feeling tired and feeling like she has appetite changes. Her husband has a lot of chronic health issues and is pretty much homebound. 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  Started vesicare. No S.E.  Has been hlepful.   She also plans on weaning her caffiene as well.     Review of Systems     Objective:   Physical Exam  Constitutional: She is oriented to person, place, and time. She appears well-developed and well-nourished.  HENT:  Head: Normocephalic and atraumatic.  Cardiovascular: Normal rate, regular rhythm and normal heart sounds.   Pulmonary/Chest: Effort normal and breath sounds normal.  Neurological: She is alert and oriented to person, place, and time.  Skin: Skin is warm and dry.  Psychiatric: She has a normal mood and affect. Her behavior is normal.          Assessment & Plan:  Depression/anxiety-PHQ 9 score of 4 today, which is down from previous of 8. and gad 7 score of 2.  Doing well on 150mg  QD. She's happy with her current regimen and I feel like she is in the therapeutic range at this point in time. F/U in 2-3 months.    Urge/stress incontinence- doing well on the vesicare. Continue work on weaning caffeine. Well-adjusted at her followup visit.

## 2014-01-08 ENCOUNTER — Other Ambulatory Visit: Payer: Self-pay | Admitting: Family Medicine

## 2014-02-21 ENCOUNTER — Other Ambulatory Visit: Payer: Self-pay | Admitting: *Deleted

## 2014-02-21 DIAGNOSIS — R921 Mammographic calcification found on diagnostic imaging of breast: Secondary | ICD-10-CM

## 2014-02-27 ENCOUNTER — Encounter: Payer: Self-pay | Admitting: Family Medicine

## 2014-03-14 ENCOUNTER — Ambulatory Visit (INDEPENDENT_AMBULATORY_CARE_PROVIDER_SITE_OTHER): Payer: No Typology Code available for payment source

## 2014-03-14 ENCOUNTER — Encounter: Payer: Self-pay | Admitting: Family Medicine

## 2014-03-14 ENCOUNTER — Ambulatory Visit (INDEPENDENT_AMBULATORY_CARE_PROVIDER_SITE_OTHER): Payer: No Typology Code available for payment source | Admitting: Family Medicine

## 2014-03-14 VITALS — BP 116/70 | HR 89 | Wt 191.0 lb

## 2014-03-14 DIAGNOSIS — E785 Hyperlipidemia, unspecified: Secondary | ICD-10-CM

## 2014-03-14 DIAGNOSIS — M25572 Pain in left ankle and joints of left foot: Secondary | ICD-10-CM

## 2014-03-14 DIAGNOSIS — Z23 Encounter for immunization: Secondary | ICD-10-CM

## 2014-03-14 DIAGNOSIS — F341 Dysthymic disorder: Secondary | ICD-10-CM

## 2014-03-14 DIAGNOSIS — E559 Vitamin D deficiency, unspecified: Secondary | ICD-10-CM

## 2014-03-14 DIAGNOSIS — M25571 Pain in right ankle and joints of right foot: Secondary | ICD-10-CM

## 2014-03-14 LAB — COMPLETE METABOLIC PANEL WITH GFR
ALK PHOS: 54 U/L (ref 39–117)
ALT: 11 U/L (ref 0–35)
AST: 16 U/L (ref 0–37)
Albumin: 4.4 g/dL (ref 3.5–5.2)
BUN: 14 mg/dL (ref 6–23)
CALCIUM: 9.9 mg/dL (ref 8.4–10.5)
CO2: 25 mEq/L (ref 19–32)
CREATININE: 0.94 mg/dL (ref 0.50–1.10)
Chloride: 103 mEq/L (ref 96–112)
GFR, Est African American: 77 mL/min
GFR, Est Non African American: 67 mL/min
Glucose, Bld: 102 mg/dL — ABNORMAL HIGH (ref 70–99)
Potassium: 4.5 mEq/L (ref 3.5–5.3)
Sodium: 138 mEq/L (ref 135–145)
Total Bilirubin: 0.5 mg/dL (ref 0.2–1.2)
Total Protein: 6.8 g/dL (ref 6.0–8.3)

## 2014-03-14 LAB — LIPID PANEL
CHOL/HDL RATIO: 4.8 ratio
CHOLESTEROL: 300 mg/dL — AB (ref 0–200)
HDL: 63 mg/dL (ref >39)
LDL CALC: 198 mg/dL — AB (ref 0–99)
TRIGLYCERIDES: 193 mg/dL — AB (ref <150)
VLDL: 39 mg/dL (ref 0–40)

## 2014-03-14 NOTE — Progress Notes (Signed)
   Subjective:    Patient ID: Carolyn [name], female    DOB: February 05, 1956, 58 y.o.   MRN: [id number]  HPI  Follow-up depression and anxiety-overall she feels like her sertraline 100 mg daily is working well. She's not any palms are side effects with the medication itself. She feels like over all her medication is well-controlled. She's had a few increase stressors. Her husband fell several times which required an emergency department visit. So she's been having to help him more. She did start thinking caffeine again about a month ago because she was feeling tired and overwhelmed. She does feel like she's getting back on track though.  Has been having feet problems and seeing Dr. Ihor GullyBiggerstaff. She is wearing braces on both ankles. Has f/u on Friday. She has been up on her feet more shopping and trying to cook more.  They may do some PT for her ankles.  She really feels like having a lot of pain in her shin.   Prior hx of Vit D deficiency.  She has been having some shin pain.  She does not take any supplemental vitamin D. And has a prior history of deficiency.  Hyperlpidemia- due to recheck. Has been a year. Not on medication.  Has been working on diet.      Review of Systems     Objective:   Physical Exam  Constitutional: She is oriented to person, place, and time. She appears well-developed and well-nourished.  HENT:  Head: Normocephalic and atraumatic.  Cardiovascular: Normal rate, regular rhythm and normal heart sounds.   Pulmonary/Chest: Effort normal and breath sounds normal.  Neurological: She is alert and oriented to person, place, and time.  Skin: Skin is warm and dry.  Psychiatric: She has a normal mood and affect. Her behavior is normal.          Assessment & Plan:  Depression/Anxiety - PHQ- 9 score of 4 and GAD- 7 score of 4.  Stable and under good control. Continue current regimen. Follow-up in 4 months. She called she has any problems or concerns.  Vit D defi - will  recheck Vitamin D levels again. Last checked about 2 years ago and she was borderline low at that point in time. She is not been taking a vitamin D supplement since then.  Hyperlipidemia - Due to recheck lipids.   Continue pravastatin 40 mg nightly.  Bilateral ankle pain-following with Dr. Hettie HolsteinScott Biggerstaff.

## 2014-03-15 LAB — VITAMIN D 25 HYDROXY (VIT D DEFICIENCY, FRACTURES): Vit D, 25-Hydroxy: 35 ng/mL (ref 30–89)

## 2014-03-16 ENCOUNTER — Other Ambulatory Visit: Payer: Self-pay | Admitting: Family Medicine

## 2014-03-16 MED ORDER — ATORVASTATIN CALCIUM 40 MG PO TABS: 40.0000 mg | ORAL_TABLET | Freq: Every day | ORAL | Status: DC

## 2014-03-28 ENCOUNTER — Other Ambulatory Visit: Payer: Self-pay | Admitting: Family Medicine

## 2014-05-04 HISTORY — PX: TONSILLECTOMY: SHX5217

## 2014-06-08 ENCOUNTER — Other Ambulatory Visit: Payer: Self-pay | Admitting: Family Medicine

## 2014-07-13 ENCOUNTER — Ambulatory Visit: Payer: No Typology Code available for payment source | Admitting: Family Medicine

## 2014-08-11 ENCOUNTER — Other Ambulatory Visit: Payer: Self-pay | Admitting: Family Medicine

## 2014-08-30 ENCOUNTER — Other Ambulatory Visit: Payer: Self-pay | Admitting: Family Medicine

## 2014-09-21 ENCOUNTER — Other Ambulatory Visit: Payer: Self-pay | Admitting: *Deleted

## 2014-09-21 DIAGNOSIS — R921 Mammographic calcification found on diagnostic imaging of breast: Secondary | ICD-10-CM

## 2014-10-25 ENCOUNTER — Emergency Department
Admission: EM | Admit: 2014-10-25 | Discharge: 2014-10-25 | Disposition: A | Discharge status: Home / Self Care | Payer: BLUE CROSS/BLUE SHIELD | Source: Home / Self Care | Attending: Emergency Medicine | Admitting: Emergency Medicine

## 2014-10-25 ENCOUNTER — Telehealth: Payer: Self-pay | Admitting: *Deleted

## 2014-10-25 ENCOUNTER — Encounter: Payer: Self-pay | Admitting: Emergency Medicine

## 2014-10-25 DIAGNOSIS — L247 Irritant contact dermatitis due to plants, except food: Secondary | ICD-10-CM

## 2014-10-25 MED ORDER — PREDNISONE 20 MG PO TABS: 20.0000 mg | ORAL_TABLET | Freq: Two times a day (BID) | ORAL | Status: DC

## 2014-10-25 MED ORDER — HYDROXYZINE HCL 25 MG PO TABS: 25.0000 mg | ORAL_TABLET | Freq: Three times a day (TID) | ORAL | Status: DC | PRN

## 2014-10-25 MED ORDER — METHYLPREDNISOLONE ACETATE 80 MG/ML IJ SUSP
80.0000 mg | Freq: Once | INTRAMUSCULAR | Status: AC
  Administered 2014-10-25: 80 mg via INTRAMUSCULAR

## 2014-10-25 NOTE — Telephone Encounter (Signed)
It looks like she went to Urgent Care.

## 2014-10-25 NOTE — ED Notes (Signed)
Poison ivy on face and left arm x 3 days

## 2014-10-25 NOTE — ED Provider Notes (Signed)
CSN: [id number]     Arrival date & time 10/25/14  1257 History   First MD Initiated Contact with Patient 10/25/14 1303     Chief Complaint  Patient presents with  . Rash   (Consider location/radiation/quality/duration/timing/severity/associated sxs/prior Treatment) HPI Was working in yard 4 days ago, then over past 3 days, worsening pruritic red rash left side of face that is "spreading". OTC meds not helping. No fever or chills or nausea or vomiting History reviewed. No pertinent past medical history. Past Surgical History  Procedure Laterality Date  . Tonsillectomy  16  . Abdominal hysterectomy  10/90    has her left ovary   Family History  Problem Relation Age of Onset  . Cancer Other     breast  . Diabetes Father   . Heart attack Brother    History  Substance Use Topics  . Smoking status: Never Smoker   . Smokeless tobacco: Not on file  . Alcohol Use: No   OB History    No data available     Review of Systems  All other systems reviewed and are negative.   Allergies  Review of patient's allergies indicates not on file.  Home Medications   Prior to Admission medications   Medication Sig Start Date End Date Taking? Authorizing Provider  atorvastatin (LIPITOR) 40 MG tablet Take 1 tablet (40 mg total) by mouth at bedtime. 03/16/14   Agapito Games, MD  cyclobenzaprine (FLEXERIL) 5 MG tablet TAKE 1 OR 2 TABLETS DAILY AT BEDTIME AS NEEDED FOR MUSCLE SPASMS 08/13/14   Agapito Games, MD  diclofenac (VOLTAREN) 75 MG EC tablet TAKE 1 TABLET TWICE DAILY AS NEEDED 08/30/14   Agapito Games, MD  estradiol (VIVELLE-DOT) 0.0375 MG/24HR  06/02/13   Historical Provider, MD  fluticasone (FLONASE) 50 MCG/ACT nasal spray Place 2 sprays into the nose daily. 10/12/11 10/11/12  Agapito Games, MD  hydrOXYzine (ATARAX/VISTARIL) 25 MG tablet Take 1 tablet (25 mg total) by mouth every 8 (eight) hours as needed for itching. May cause drowsiness. 10/25/14   Lajean Manes, MD  predniSONE (DELTASONE) 20 MG tablet Take 1 tablet (20 mg total) by mouth 2 (two) times daily with a meal. X 7 days 10/25/14   Lajean Manes, MD  sertraline (ZOLOFT) 100 MG tablet TAKE 1.5 TABLETS DAILY 01/09/14   Agapito Games, MD  solifenacin (VESICARE) 10 MG tablet Take 1 tablet (10 mg total) by mouth daily. 10/09/13   Agapito Games, MD   BP 113/76 mmHg  Pulse 73  Temp(Src) 98.9 F (37.2 C) (Oral)  Ht  (1.651 m)  Wt 206 lb (93.441 kg)  BMI 34.28 kg/m2  SpO2 98% Physical Exam  Constitutional: She is oriented to person, place, and time. She appears well-developed and well-nourished. No distress.  HENT:  Head: Normocephalic and atraumatic.  Eyes: Conjunctivae and EOM are normal. Pupils are equal, round, and reactive to light. No scleral icterus.  Neck: Normal range of motion.  Cardiovascular: Normal rate.   Pulmonary/Chest: Effort normal.  Abdominal: She exhibits no distension.  Musculoskeletal: Normal range of motion.  Neurological: She is alert and oriented to person, place, and time.  Skin: Skin is warm. Rash noted.  Severe Erythematous poison ivy type rash left face and neck. Few vesicles. No pustules. No sign of infection. No drainage.  Psychiatric: She has a normal mood and affect.  Nursing note and vitals reviewed.   ED Course  Procedures (including critical care time) Labs Review  Labs Reviewed - No data to display  Imaging Review No results found.   MDM   1. Contact dermatitis and eczema due to plant    severe poison ivy face  Depo-Medrol 80 mg IM stat New Prescriptions   HYDROXYZINE (ATARAX/VISTARIL) 25 MG TABLET    Take 1 tablet (25 mg total) by mouth every 8 (eight) hours as needed for itching. May cause drowsiness.   PREDNISONE (DELTASONE) 20 MG TABLET    Take 1 tablet (20 mg total) by mouth 2 (two) times daily with a meal. X 7 days   Other symptomatic care and other advice given Follow-up with your dermatologist or primary care  doctor in 5-7 days if not improving, or sooner if symptoms become worse. Precautions discussed. Red flags discussed. Questions invited and answered. Patient voiced understanding and agreement.     Lajean Manes, MD 10/25/14 [phone]

## 2014-10-25 NOTE — Telephone Encounter (Signed)
Pt left vm stating that she has some poison ivy on her face only and wanted to know if you could just send her something in to her pharm since there are no available appts.  Please advise.

## 2014-11-16 ENCOUNTER — Other Ambulatory Visit: Payer: Self-pay | Admitting: Family Medicine

## 2014-11-19 ENCOUNTER — Encounter: Payer: Self-pay | Admitting: Family Medicine

## 2014-12-24 ENCOUNTER — Other Ambulatory Visit: Payer: Self-pay | Admitting: Family Medicine

## 2015-01-14 ENCOUNTER — Other Ambulatory Visit: Payer: Self-pay | Admitting: Family Medicine

## 2015-01-18 ENCOUNTER — Ambulatory Visit (INDEPENDENT_AMBULATORY_CARE_PROVIDER_SITE_OTHER): Payer: BLUE CROSS/BLUE SHIELD | Admitting: Family Medicine

## 2015-01-18 ENCOUNTER — Encounter: Payer: Self-pay | Admitting: Family Medicine

## 2015-01-18 VITALS — BP 127/72 | HR 82 | Temp 98.7°F | Wt 212.0 lb

## 2015-01-18 DIAGNOSIS — Z6834 Body mass index (BMI) 34.0-34.9, adult: Secondary | ICD-10-CM

## 2015-01-18 DIAGNOSIS — H04123 Dry eye syndrome of bilateral lacrimal glands: Secondary | ICD-10-CM

## 2015-01-18 DIAGNOSIS — Z114 Encounter for screening for human immunodeficiency virus [HIV]: Secondary | ICD-10-CM | POA: Diagnosis not present

## 2015-01-18 DIAGNOSIS — Z23 Encounter for immunization: Secondary | ICD-10-CM | POA: Diagnosis not present

## 2015-01-18 DIAGNOSIS — Z1159 Encounter for screening for other viral diseases: Secondary | ICD-10-CM

## 2015-01-18 DIAGNOSIS — R635 Abnormal weight gain: Secondary | ICD-10-CM

## 2015-01-18 DIAGNOSIS — E785 Hyperlipidemia, unspecified: Secondary | ICD-10-CM | POA: Diagnosis not present

## 2015-01-18 DIAGNOSIS — F418 Other specified anxiety disorders: Secondary | ICD-10-CM

## 2015-01-18 DIAGNOSIS — H04129 Dry eye syndrome of unspecified lacrimal gland: Secondary | ICD-10-CM | POA: Insufficient documentation

## 2015-01-18 MED ORDER — FLUTICASONE PROPIONATE 50 MCG/ACT NA SUSP: 2.0000 | Freq: Every day | NASAL | Status: DC

## 2015-01-18 MED ORDER — CYCLOBENZAPRINE HCL 5 MG PO TABS: ORAL_TABLET | ORAL | Status: DC

## 2015-01-18 MED ORDER — SERTRALINE HCL 100 MG PO TABS: 100.0000 mg | ORAL_TABLET | Freq: Every day | ORAL | Status: DC

## 2015-01-18 NOTE — Progress Notes (Signed)
   Subjective:    Patient ID: Carolyn [name], female    DOB: 1955/05/30, 59 y.o.   MRN: [id number]  HPI Follow-up depression/anxiety- currently on Zoloft 100 mg daily.Previous PHQ 9 and Gad 7 scores were 4. Her younger brother died from Lucas. Mother dx with BrCa last month. She does complain of feeling restless and having trouble relaxing. She also complains of feeling down and depressed several days of the week and feeling like she is overeating due to stress. She also complains of low energy. But denies any thoughts of wanting to harm herself.  Hyperlipidemia-currently on pravastatin 40 mg and doing well with it. No myalgias or side effects.she had stopped it before the last lipid check last November and she is due to repeat that today.  Dx with Dry eyes and started on restasis.   Review of Systems     Objective:   Physical Exam  Constitutional: She is oriented to person, place, and time. She appears well-developed and well-nourished.  HENT:  Head: Normocephalic and atraumatic.  Cardiovascular: Normal rate, regular rhythm and normal heart sounds.   Pulmonary/Chest: Effort normal and breath sounds normal.  Neurological: She is alert and oriented to person, place, and time.  Skin: Skin is warm and dry.  Psychiatric: She has a normal mood and affect. Her behavior is normal.          Assessment & Plan:  Depression/anxiety- PHQ- 9 score of 6, GAD- 7 score of 4. Symptoms not difficulty at all. Continue current rgimen.    Hyperlipidemia-due for repeat lipid panel now that she is back on the medication.  Abnormal weight gain/BMI 34  - we discussed using smart phone app called My Fitness pal.  Setting calorie goals and looking at ways to start exercise. Water aerobics may be a great choice so that her husband can go too.  He has joint problems.    Dry Eye - following with Dr. Jodi Mourning.

## 2015-02-28 ENCOUNTER — Other Ambulatory Visit: Payer: Self-pay | Admitting: Family Medicine

## 2015-03-05 ENCOUNTER — Other Ambulatory Visit: Payer: Self-pay | Admitting: Family Medicine

## 2015-05-31 ENCOUNTER — Other Ambulatory Visit: Payer: Self-pay | Admitting: Family Medicine

## 2015-07-18 ENCOUNTER — Ambulatory Visit (INDEPENDENT_AMBULATORY_CARE_PROVIDER_SITE_OTHER): Payer: BLUE CROSS/BLUE SHIELD | Admitting: Family Medicine

## 2015-07-18 ENCOUNTER — Encounter: Payer: Self-pay | Admitting: Family Medicine

## 2015-07-18 VITALS — BP 130/71 | HR 87 | Wt 200.0 lb

## 2015-07-18 DIAGNOSIS — F418 Other specified anxiety disorders: Secondary | ICD-10-CM | POA: Diagnosis not present

## 2015-07-18 DIAGNOSIS — R635 Abnormal weight gain: Secondary | ICD-10-CM

## 2015-07-18 DIAGNOSIS — J069 Acute upper respiratory infection, unspecified: Secondary | ICD-10-CM

## 2015-07-18 DIAGNOSIS — E785 Hyperlipidemia, unspecified: Secondary | ICD-10-CM

## 2015-07-18 MED ORDER — SERTRALINE HCL 100 MG PO TABS: 150.0000 mg | ORAL_TABLET | Freq: Every day | ORAL | Status: DC

## 2015-07-18 NOTE — Progress Notes (Signed)
Subjective:    Patient ID: Carolyn [name], female    DOB: 04-14-1956, 60 y.o.   MRN: [id number]  HPI F/U Depression/Anxiety - She is currently on sertraline for milligrams daily.  Obesity/BMI33 - she has been doing the high protein skakes as a meal.  Has a set meal plan and has lost 12 lbs.  Has been going home to eat lunch.    Hyperlipidemia-currently on atorvastatin 40 mg and tolerating it well without any side effects or problems.  2 days of nasal congestion, mild ST and fatigue. No cough. + HA last night.  Taking Zicam.   Review of Systems  BP 130/71 mmHg  Pulse 87  Wt 200 lb (90.719 kg)  SpO2 100%    Not on File  No past medical history on file.  Past Surgical History  Procedure Laterality Date  . Tonsillectomy  16  . Abdominal hysterectomy  10/90    has her left ovary    Social History   Social History  . Marital Status: Married    Spouse Name: N/A  . Number of Children: N/A  . Years of Education: N/A   Occupational History  . Not on file.   Social History Main Topics  . Smoking status: Never Smoker   . Smokeless tobacco: Not on file  . Alcohol Use: No  . Drug Use: No  . Sexual Activity: Not on file   Other Topics Concern  . Not on file   Social History Narrative    Family History  Problem Relation Age of Onset  . Cancer Other     breast  . Diabetes Father   . Heart attack Brother   . Breast cancer Mother 65  . Lung cancer Brother     Outpatient Encounter Prescriptions as of 07/18/2015  Medication Sig  . atorvastatin (LIPITOR) 40 MG tablet Take 1 tablet (40 mg total) by mouth at bedtime.  . cyclobenzaprine (FLEXERIL) 5 MG tablet TAKE 1 OR 2 TABLETS DAILY AT BEDTIME AS NEEDED FOR MUSCLE SPASMS  . cycloSPORINE (RESTASIS) 0.05 % ophthalmic emulsion 1 drop 2 (two) times daily.  . diclofenac (VOLTAREN) 75 MG EC tablet TAKE ONE TABLET TWICE DAILY AS NEEDED  . estradiol (VIVELLE-DOT) 0.0375 MG/24HR   . fluticasone (FLONASE) 50 MCG/ACT nasal  spray Place 2 sprays into both nostrils daily.  . sertraline (ZOLOFT) 100 MG tablet Take 1.5 tablets (150 mg total) by mouth daily.  . [DISCONTINUED] sertraline (ZOLOFT) 100 MG tablet TAKE 1 TABLET EVERY DAY  . [DISCONTINUED] hydrOXYzine (ATARAX/VISTARIL) 25 MG tablet Take 1 tablet (25 mg total) by mouth every 8 (eight) hours as needed for itching. May cause drowsiness.  . [DISCONTINUED] solifenacin (VESICARE) 10 MG tablet Take 1 tablet (10 mg total) by mouth daily.   No facility-administered encounter medications on file as of 07/18/2015.          Objective:   Physical Exam  Constitutional: She is oriented to person, place, and time. She appears well-developed and well-nourished.  HENT:  Head: Normocephalic and atraumatic.  Right Ear: External ear normal.  Left Ear: External ear normal.  Nose: Nose normal.  Mouth/Throat: Oropharynx is clear and moist.  TMs and canals are clear.   Eyes: Conjunctivae and EOM are normal. Pupils are equal, round, and reactive to light.  Neck: Neck supple. No thyromegaly present.  Cardiovascular: Normal rate, regular rhythm and normal heart sounds.   Pulmonary/Chest: Effort normal and breath sounds normal. She has no wheezes.  Lymphadenopathy:  She has no cervical adenopathy.  Neurological: She is alert and oriented to person, place, and time.  Skin: Skin is warm and dry.  Psychiatric: She has a normal mood and affect. Her behavior is normal.          Assessment & Plan:  Depression/anxiety- PHQ- 9 score of 7 and GAD- 7 score of 12. Rates sxs as somewhat difficult.  Increase esrtraline to 150mg .   Abnormal weight gain - she has lost 12 lbs.   Hyperlipidemia-patient never went for blood work back in September 6 months ago. Will repeat lab slip today and encouraged her to go.  URI - likley viral. Gave reassurance. Call i fnot better in one week.   Dsicussed colon cancer screening. She would like to do Cologuard.

## 2015-07-18 NOTE — Patient Instructions (Signed)
I am increasing your sertraline to 1.5 tabs daily to help with your mood.

## 2015-07-26 ENCOUNTER — Other Ambulatory Visit: Payer: Self-pay | Admitting: Family Medicine

## 2015-08-13 ENCOUNTER — Telehealth: Payer: Self-pay

## 2015-08-15 NOTE — Telephone Encounter (Signed)
Order faxed.Johnanna Bakke [name]  

## 2015-09-25 LAB — COLOGUARD: Cologuard: NEGATIVE

## 2015-09-26 ENCOUNTER — Telehealth: Payer: Self-pay | Admitting: Family Medicine

## 2015-09-26 NOTE — Telephone Encounter (Signed)
vm not set up.Carolyn PacasBarkley, Carolyn Neace EpesLynetta

## 2015-09-26 NOTE — Telephone Encounter (Signed)
Call pt: Color guard test negative. Will need repeat colon cancer screening in 3 years.

## 2015-09-26 NOTE — Telephone Encounter (Signed)
vm full.Makyla Bye [name]  

## 2015-09-27 NOTE — Telephone Encounter (Signed)
vm full.Carolyn [name], Carolyn [name]

## 2015-10-02 ENCOUNTER — Encounter: Payer: Self-pay | Admitting: *Deleted

## 2015-10-02 ENCOUNTER — Encounter: Payer: Self-pay | Admitting: Family Medicine

## 2015-10-02 NOTE — Telephone Encounter (Signed)
Letter sent.Carolyn [name]  

## 2015-10-02 NOTE — Telephone Encounter (Signed)
Will send letter.Carolyn PacasBarkley, Carolyn [name] Carolyn [name]

## 2015-10-18 ENCOUNTER — Ambulatory Visit: Payer: BLUE CROSS/BLUE SHIELD | Admitting: Family Medicine

## 2015-10-18 LAB — COMPLETE METABOLIC PANEL WITH GFR
ALT: 16 U/L (ref 6–29)
AST: 17 U/L (ref 10–35)
Albumin: 4.4 g/dL (ref 3.6–5.1)
Alkaline Phosphatase: 53 U/L (ref 33–130)
BUN: 27 mg/dL — ABNORMAL HIGH (ref 7–25)
CO2: 24 mmol/L (ref 20–31)
Calcium: 9.6 mg/dL (ref 8.6–10.4)
Chloride: 103 mmol/L (ref 98–110)
Creat: 0.91 mg/dL (ref 0.50–0.99)
GFR, Est African American: 79 mL/min (ref >=60)
GFR, Est Non African American: 69 mL/min (ref >=60)
Glucose, Bld: 93 mg/dL (ref 65–99)
Potassium: 5.1 mmol/L (ref 3.5–5.3)
Sodium: 141 mmol/L (ref 135–146)
Total Bilirubin: 0.4 mg/dL (ref 0.2–1.2)
Total Protein: 6.9 g/dL (ref 6.1–8.1)

## 2015-10-18 LAB — HEPATITIS C ANTIBODY: HCV AB: NEGATIVE

## 2015-10-18 LAB — LIPID PANEL
Cholesterol: 239 mg/dL — ABNORMAL HIGH (ref 125–200)
HDL: 66 mg/dL (ref >=46)
LDL Cholesterol: 145 mg/dL — ABNORMAL HIGH (ref <130)
Total CHOL/HDL Ratio: 3.6 Ratio (ref <=5.0)
Triglycerides: 142 mg/dL (ref <150)
VLDL: 28 mg/dL (ref <30)

## 2015-10-18 LAB — HIV ANTIBODY (ROUTINE TESTING W REFLEX): HIV: NONREACTIVE

## 2015-10-28 ENCOUNTER — Ambulatory Visit (INDEPENDENT_AMBULATORY_CARE_PROVIDER_SITE_OTHER): Payer: BLUE CROSS/BLUE SHIELD | Admitting: Family Medicine

## 2015-10-28 ENCOUNTER — Encounter: Payer: Self-pay | Admitting: Family Medicine

## 2015-10-28 VITALS — BP 113/67 | HR 82 | Wt 193.0 lb

## 2015-10-28 DIAGNOSIS — F418 Other specified anxiety disorders: Secondary | ICD-10-CM | POA: Diagnosis not present

## 2015-10-28 DIAGNOSIS — E785 Hyperlipidemia, unspecified: Secondary | ICD-10-CM | POA: Diagnosis not present

## 2015-10-28 DIAGNOSIS — R635 Abnormal weight gain: Secondary | ICD-10-CM

## 2015-10-28 NOTE — Progress Notes (Signed)
Subjective:    CC: Mood, weight  HPI:  Three-month follow-up for depression with anxiety-when I last saw her we decided to increase her sertraline 150 mg.  Abnormal weight gain-er BMI was down to 33 when I last saw her. She was mostly concentrating on high protein shakes as a meal.she had Artie lost 12 pounds at that time.She has lost 7 more lbs.    Hyperlipidemia - wants to go over lab results from last week.    Past medical history, Surgical history, Family history not pertinant except as noted below, Social history, Allergies, and medications have been entered into the medical record, reviewed, and corrections made.   Review of Systems: No fevers, chills, night sweats, weight loss, chest pain, or shortness of breath.   Objective:    General: Well Developed, well nourished, and in no acute distress.  Neuro: Alert and oriented x3, extra-ocular muscles intact, sensation grossly intact.  HEENT: Normocephalic, atraumatic  Skin: Warm and dry, no rashes. Cardiac: Regular rate and rhythm, no murmurs rubs or gallops, no lower extremity edema.  Respiratory: Clear to auscultation bilaterally. Not using accessory muscles, speaking in full sentences.   Impression and Recommendations:   Depression/anxiety- PHQ-9 score of 2, and GAD- 7 score of 3.    Abnormal weight gain- she has lost 7 more lbs and is doing great.  Continue with diet and exercise.  Hyperlipidemia-reviewed cholesterol results with her today. Discussed possibly increasing Lipitor to 80 mg versus continue with diet and exercise and weight loss   Discussed need for shingles vaccine. Will check on insurance.

## 2015-12-07 ENCOUNTER — Other Ambulatory Visit: Payer: Self-pay | Admitting: Family Medicine

## 2016-01-28 ENCOUNTER — Ambulatory Visit: Payer: BLUE CROSS/BLUE SHIELD | Admitting: Family Medicine

## 2016-01-30 ENCOUNTER — Ambulatory Visit (INDEPENDENT_AMBULATORY_CARE_PROVIDER_SITE_OTHER): Payer: BLUE CROSS/BLUE SHIELD | Admitting: Family Medicine

## 2016-01-30 ENCOUNTER — Encounter: Payer: Self-pay | Admitting: Family Medicine

## 2016-01-30 VITALS — BP 134/79 | HR 90 | Wt 196.0 lb

## 2016-01-30 DIAGNOSIS — R635 Abnormal weight gain: Secondary | ICD-10-CM | POA: Diagnosis not present

## 2016-01-30 DIAGNOSIS — M797 Fibromyalgia: Secondary | ICD-10-CM

## 2016-01-30 DIAGNOSIS — Z23 Encounter for immunization: Secondary | ICD-10-CM

## 2016-01-30 DIAGNOSIS — F341 Dysthymic disorder: Secondary | ICD-10-CM

## 2016-01-30 MED ORDER — CYCLOBENZAPRINE HCL 5 MG PO TABS: ORAL_TABLET | ORAL | 1 refills | Status: DC

## 2016-01-30 MED ORDER — SERTRALINE HCL 100 MG PO TABS: 150.0000 mg | ORAL_TABLET | Freq: Every day | ORAL | 1 refills | Status: DC

## 2016-01-30 MED ORDER — DICLOFENAC SODIUM 75 MG PO TBEC
DELAYED_RELEASE_TABLET | ORAL | 2 refills | Status: DC
Start: 2016-01-30 — End: 2019-03-03

## 2016-01-30 NOTE — Progress Notes (Signed)
Subjective:     Patient ID: Carolyn [name], female   DOB: October 20, 1955, 60 y.o.   MRN: [id number]  HPI The patient is a 60 y.o. Caucasian female presenting today for management of her depression and anxiety. Patient reports that she is actively trying to lose weight and has recently gained 2 lbs. She states that she is not exercising as normal due to her recent travels. The patient notes some worsening of her anxiety and depression secondary to increased stress over the past several months. The patient notes that her granddaughter was just diagnosed with gastroparesis, her daughter struggled through a custody battle, and her husband has been struggling with opioid dependency and is concerned about new legislative restrictions. The patient states that she has had some increased difficult falling asleep and has been using her Flexeril. The patient denies suicidal ideation, chest pain, shortness of breath, panic attacks, or activity changes.  Review of Systems  Constitutional: Negative for activity change, appetite change, chills, diaphoresis, fatigue, fever and unexpected weight change.  HENT: Negative.   Eyes: Negative.   Respiratory: Negative for cough, chest tightness, shortness of breath and wheezing.   Cardiovascular: Negative for chest pain, palpitations and leg swelling.  Gastrointestinal: Negative.   Endocrine: Negative.   Genitourinary: Negative.   Musculoskeletal: Negative.   Skin: Negative.   Allergic/Immunologic: Negative.   Neurological: Negative.   Psychiatric/Behavioral: Positive for sleep disturbance. Negative for behavioral problems, confusion, decreased concentration, dysphoric mood, hallucinations, self-injury and suicidal ideas. The patient is nervous/anxious. The patient is not hyperactive.       Objective:   Physical Exam  Constitutional: She is oriented to person, place, and time. She appears well-developed and well-nourished. No distress.  HENT:  Head: Normocephalic and  atraumatic.  Eyes: Conjunctivae and EOM are normal. Pupils are equal, round, and reactive to light. Right eye exhibits no discharge. Left eye exhibits no discharge. No scleral icterus.  Neck: Normal range of motion. Neck supple. No tracheal deviation present. No thyromegaly present.  Cardiovascular: Normal rate, regular rhythm, normal heart sounds and intact distal pulses.  Exam reveals no gallop and no friction rub.   No murmur heard. Pulmonary/Chest: Effort normal and breath sounds normal. No respiratory distress. She has no wheezes. She has no rales. She exhibits no tenderness.  Neurological: She is alert and oriented to person, place, and time. No cranial nerve deficit. Coordination normal.  Skin: Skin is warm and dry. No rash noted. She is not diaphoretic. No erythema. No pallor.  Psychiatric: She has a normal mood and affect. Her behavior is normal. Judgment and thought content normal.      Assessment:       Carolyn [name] was seen today for mood and weight check.  Diagnoses and all orders for this visit:  DEPRESSION/ANXIETY -     sertraline (ZOLOFT) 100 MG tablet; Take 1.5 tablets (150 mg total) by mouth daily.  Abnormal weight gain  Fibromyalgia -     cyclobenzaprine (FLEXERIL) 5 MG tablet; TAKE 1 OR 2 TABLETS DAILY AT BEDTIME AS NEEDED FOR MUSCLE SPASMS -     diclofenac (VOLTAREN) 75 MG EC tablet; TAKE ONE TABLET TWICE DAILY AS NEEDED  Encounter for immunization -     cyclobenzaprine (FLEXERIL) 5 MG tablet; TAKE 1 OR 2 TABLETS DAILY AT BEDTIME AS NEEDED FOR MUSCLE SPASMS -     diclofenac (VOLTAREN) 75 MG EC tablet; TAKE ONE TABLET TWICE DAILY AS NEEDED -     sertraline (ZOLOFT) 100 MG tablet; Take 1.5 tablets (  150 mg total) by mouth daily. -     Flu Vaccine QUAD 36+ mos IM   Plan:     1. Depression/Anxiety - Patient completed PHQ-9 with a score of 5 and GAD-7 with a score of 4. Despite some recent increases in stress, patient states that she feels well-controlled on her medication  regimen of Sertraline 150 mg daily. Patient to follow-up in 4 months for reevaluation and medication management.  2. Abnormal weight gain: Discussed with patient the need for daily dietary implementation with a focus on portion control and daily caloric deficits. Patient advised to explore her resources for increased exercise such as water aerobics at the [facility]. Patient seem responsive to making lifestyle changes at this time as she has shown great progress over the past several months.  3. Fibromyalgia - Patient is currently well-controlled on her medical management. Patient to continue on Voltaren 75 mg tablets twice daily as need and Flexeril 5mg  tablets as needed at night. Patient to follow-up in 4 months for evaluation and medication management.  Summary- Patient received flu shot in-clinic today.

## 2016-01-30 NOTE — Progress Notes (Signed)
Agree with above. I also spoke with and examined the patient. Continue with care plan as below.

## 2016-01-30 NOTE — Progress Notes (Signed)
Mood-

## 2016-05-01 ENCOUNTER — Other Ambulatory Visit: Payer: Self-pay | Admitting: Family Medicine

## 2016-05-01 DIAGNOSIS — M797 Fibromyalgia: Secondary | ICD-10-CM

## 2016-06-01 ENCOUNTER — Encounter: Payer: Self-pay | Admitting: Family Medicine

## 2016-06-01 ENCOUNTER — Ambulatory Visit (INDEPENDENT_AMBULATORY_CARE_PROVIDER_SITE_OTHER): Payer: BLUE CROSS/BLUE SHIELD | Admitting: Family Medicine

## 2016-06-01 VITALS — BP 121/75 | HR 77 | Ht 65.0 in | Wt 202.0 lb

## 2016-06-01 DIAGNOSIS — Z6834 Body mass index (BMI) 34.0-34.9, adult: Secondary | ICD-10-CM | POA: Diagnosis not present

## 2016-06-01 DIAGNOSIS — W101XXA Fall (on)(from) sidewalk curb, initial encounter: Secondary | ICD-10-CM

## 2016-06-01 DIAGNOSIS — F341 Dysthymic disorder: Secondary | ICD-10-CM | POA: Diagnosis not present

## 2016-06-01 NOTE — Progress Notes (Signed)
Subjective:    Patient ID: Carolyn [name], female    DOB: 11-13-1955, 61 y.o.   MRN: [id number]  HPI Here for four-month follow-up of depression/anxiety. She is currently on Zoloft 150 mg daily. She is doing well to medication without any side effects. She has been under a little bit more stress recently. Her husband has chronic pain and they have recently decided to reduce his medication and he's been in more pain this is been fairly stressful with her but they are meeting with his pain management doctor later this week. She does report feeling anxious several days of the week and feeling down several days of the week. No thoughts of wanting to harm herself.  she tripped and fell a couple weeks ago on her sidewalk and landed on her right elbow and right hip. She said she had a pretty big bruise on her right forearm but it is healing well. She still has a small knot near the lateral epicondylitis.  Review of Systems   BP 121/75   Pulse 77   Ht 5' 5"  (1.651 m)   Wt 202 lb (91.6 kg)   SpO2 97%   BMI 33.61 kg/m     Not on File  No past medical history on file.  Past Surgical History:  Procedure Laterality Date  . ABDOMINAL HYSTERECTOMY  10/90   has her left ovary  . TONSILLECTOMY  15    Social History   Social History  . Marital status: Married    Spouse name: N/A  . Number of children: N/A  . Years of education: N/A   Occupational History  . Not on file.   Social History Main Topics  . Smoking status: Never Smoker  . Smokeless tobacco: Never Used  . Alcohol use No  . Drug use: No  . Sexual activity: Not on file   Other Topics Concern  . Not on file   Social History Narrative  . No narrative on file    Family History  Problem Relation Age of Onset  . Cancer Other     breast  . Diabetes Father   . Heart attack Brother   . Breast cancer Mother 49  . Lung cancer Brother     Outpatient Encounter Prescriptions as of 06/01/2016  Medication Sig  .  atorvastatin (LIPITOR) 40 MG tablet Take 1 tablet (40 mg total) by mouth at bedtime.  . cyclobenzaprine (FLEXERIL) 5 MG tablet TAKE 1 OR 2 TABLETS DAILY AT BEDTIME AS NEEDED FOR MUSCLE SPASMS  . cycloSPORINE (RESTASIS) 0.05 % ophthalmic emulsion 1 drop 2 (two) times daily.  . diclofenac (VOLTAREN) 75 MG EC tablet TAKE ONE TABLET TWICE DAILY AS NEEDED  . estradiol (VIVELLE-DOT) 0.025 MG/24HR Apply 1 patch(es) twice a week by transdermal route.  . sertraline (ZOLOFT) 100 MG tablet Take 1.5 tablets (150 mg total) by mouth daily.  . fluticasone (FLONASE) 50 MCG/ACT nasal spray Place 2 sprays into both nostrils daily.   No facility-administered encounter medications on file as of 06/01/2016.           Objective:   Physical Exam  Constitutional: She is oriented to person, place, and time. She appears well-developed and well-nourished.  HENT:  Head: Normocephalic and atraumatic.  Cardiovascular: Normal rate, regular rhythm and normal heart sounds.   Pulmonary/Chest: Effort normal and breath sounds normal.  Musculoskeletal:  She does have a small hematoma near the right lateral epicondylitis with a little bit of bruising over the surface the skin.  Neurological: She is alert and oriented to person, place, and time.  Skin: Skin is warm and dry.  Psychiatric: She has a normal mood and affect. Her behavior is normal.          Assessment & Plan:  Depression/anxiety-GAD 7 score of 5 and PHQ 9 score of 6. Rates her symptoms is somewhat difficult on both. Symptoms consistent with mild anxiety and mild depression. Discussed options including potentially adding Wellbutrin to her regimen or possibly changing the sertraline. For now she just wants to continue with her current regimen for at least the next 3-4 months and see if some of her circumstances around her family members improve. She thinks this will greatly help with her mood as well. We did discuss regular exercise as well. This would help her  with her mood as well as help her lose weight and help with gain her strength.  Fall on her sidewalk-encouraged her to consider doing some beginners tai chi. She can find this on YouTube. She does not currently exercise and that would help with lowering her risk for falls as well.  Obesity/BMI 33-encouraged regular exercise.  Time spent 20 minutes, greater than 50% time spent counseling about depression and anxiety

## 2016-07-31 ENCOUNTER — Other Ambulatory Visit: Payer: Self-pay | Admitting: Family Medicine

## 2016-07-31 DIAGNOSIS — M797 Fibromyalgia: Secondary | ICD-10-CM

## 2016-08-28 ENCOUNTER — Other Ambulatory Visit: Payer: Self-pay | Admitting: Family Medicine

## 2016-08-28 DIAGNOSIS — F341 Dysthymic disorder: Secondary | ICD-10-CM

## 2016-08-28 DIAGNOSIS — M797 Fibromyalgia: Secondary | ICD-10-CM

## 2016-09-29 ENCOUNTER — Ambulatory Visit (INDEPENDENT_AMBULATORY_CARE_PROVIDER_SITE_OTHER): Payer: BLUE CROSS/BLUE SHIELD | Admitting: Family Medicine

## 2016-09-29 ENCOUNTER — Encounter: Payer: Self-pay | Admitting: Family Medicine

## 2016-09-29 VITALS — BP 124/69 | HR 78 | Ht 65.0 in | Wt 198.0 lb

## 2016-09-29 DIAGNOSIS — F341 Dysthymic disorder: Secondary | ICD-10-CM | POA: Diagnosis not present

## 2016-09-29 DIAGNOSIS — Z6832 Body mass index (BMI) 32.0-32.9, adult: Secondary | ICD-10-CM | POA: Diagnosis not present

## 2016-09-29 MED ORDER — SERTRALINE HCL 100 MG PO TABS: 150.0000 mg | ORAL_TABLET | Freq: Every day | ORAL | 2 refills | Status: DC

## 2016-09-29 NOTE — Progress Notes (Signed)
   Subjective:    Patient ID: Carolyn [name], female    DOB: [birth year], 61 y.o.   MRN: [id number]  HPI Here today for four-month follow-up for depression/anxiety. She is currently on Zoloft 150 mg daily. He is very happy with her current regimen. Overall she feels like her moods a little bit better recently. She still working full-time and supporting her household since her husband is now on disability. 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 She says it's just hard to find the time. Sometimes she has to give up things around the house to be able to find the time to do it but it really helps her mentally recharged.  BMI 32-she's also lost 12 pounds recently. She's been on the ketogenic diet for 4 weeks and has lost 12 pounds total. She is doing better and feeling much better. Her daughter and son-in-law are doing it as well and this is been helpful for her to support her.   Review of Systems     Objective:   Physical Exam  Constitutional: She is oriented to person, place, and time. She appears well-developed and well-nourished.  HENT:  Head: Normocephalic and atraumatic.  Cardiovascular: Normal rate, regular rhythm and normal heart sounds.   Pulmonary/Chest: Effort normal and breath sounds normal.  Neurological: She is alert and oriented to person, place, and time.  Skin: Skin is warm and dry.  Psychiatric: She has a normal mood and affect. Her behavior is normal.          Assessment & Plan:  Depression/anxiety-PHQ 9 score of 4 today and GAD 7 score of 4. Improved from last time in January. Continue to work on finding this out with the help reduce her stress levels.  BMI 32-fantastic job on weight loss. Continue work on this. I love to see her get down to about 160 pounds.

## 2016-10-07 ENCOUNTER — Other Ambulatory Visit: Payer: Self-pay | Admitting: Family Medicine

## 2016-10-07 DIAGNOSIS — M797 Fibromyalgia: Secondary | ICD-10-CM

## 2017-01-01 ENCOUNTER — Other Ambulatory Visit: Payer: Self-pay | Admitting: Family Medicine

## 2017-01-01 DIAGNOSIS — M797 Fibromyalgia: Secondary | ICD-10-CM

## 2017-01-29 ENCOUNTER — Ambulatory Visit (INDEPENDENT_AMBULATORY_CARE_PROVIDER_SITE_OTHER): Payer: BLUE CROSS/BLUE SHIELD | Admitting: Family Medicine

## 2017-01-29 ENCOUNTER — Encounter: Payer: Self-pay | Admitting: Family Medicine

## 2017-01-29 VITALS — BP 136/79 | HR 86 | Ht 65.0 in | Wt 181.0 lb

## 2017-01-29 DIAGNOSIS — Z6830 Body mass index (BMI) 30.0-30.9, adult: Secondary | ICD-10-CM | POA: Diagnosis not present

## 2017-01-29 DIAGNOSIS — E7800 Pure hypercholesterolemia, unspecified: Secondary | ICD-10-CM | POA: Diagnosis not present

## 2017-01-29 DIAGNOSIS — F341 Dysthymic disorder: Secondary | ICD-10-CM | POA: Diagnosis not present

## 2017-01-29 DIAGNOSIS — S76211A Strain of adductor muscle, fascia and tendon of right thigh, initial encounter: Secondary | ICD-10-CM

## 2017-01-29 DIAGNOSIS — Z23 Encounter for immunization: Secondary | ICD-10-CM | POA: Diagnosis not present

## 2017-01-29 NOTE — Progress Notes (Signed)
Subjective:    Patient ID: Carolyn [name], female    DOB: [birth year], 61 y.o.   MRN: [id number]  HPI  F/U depression/Anxiety  - 4 month f/U. Overall she is doing well. Her mood has been well controlled. Specific concerns. Tolerating medication well without side effects. She's currently on 1-1/2 tabs daily for a total of 150 mg.  BMI  30- she is doing fantastic and has lost more weight since last time she was here. BMI is now down to 30 which is great. She still on the ketogenic diet and says she actually feels well on it.  Hyperlipidemia - not currently on medication.  She also complains of right groin pain for about 2 weeks. She denies any known injury or trauma. She says mostly painful if she tries to lift her leg for example getting in and out of the car. In fact she's been taking her opposite hand to lift the knee. She is not taking any medications for it. Has not been keeping her awake at night.   Review of Systems  BP 136/79   Pulse 86   Ht  (1.651 m)   Wt 181 lb (82.1 kg)   SpO2 98%   BMI 30.12 kg/m     Not on File  No past medical history on file.  Past Surgical History:  Procedure Laterality Date  . ABDOMINAL HYSTERECTOMY  10/90   has her left ovary  . TONSILLECTOMY  16    Social History   Social History  . Marital status: Married    Spouse name: N/A  . Number of children: N/A  . Years of education: N/A   Occupational History  . Not on file.   Social History Main Topics  . Smoking status: Never Smoker  . Smokeless tobacco: Never Used  . Alcohol use No  . Drug use: No  . Sexual activity: Not on file   Other Topics Concern  . Not on file   Social History Narrative  . No narrative on file    Family History  Problem Relation Age of Onset  . Cancer Other        breast  . Diabetes Father   . Heart attack Brother   . Breast cancer Mother 26  . Lung cancer Brother     Outpatient Encounter Prescriptions as of 01/29/2017  Medication Sig  .  cyclobenzaprine (FLEXERIL) 5 MG tablet TAKE 1 OR 2 TABLETS DAILY AT BEDTIME AS NEEDED FOR MUSCLE SPASMS  . cycloSPORINE (RESTASIS) 0.05 % ophthalmic emulsion 1 drop 2 (two) times daily.  . diclofenac (VOLTAREN) 75 MG EC tablet TAKE ONE TABLET TWICE DAILY AS NEEDED  . sertraline (ZOLOFT) 100 MG tablet Take 1.5 tablets (150 mg total) by mouth daily.  . [DISCONTINUED] estradiol (VIVELLE-DOT) 0.025 MG/24HR Apply 1 patch(es) twice a week by transdermal route.   No facility-administered encounter medications on file as of 01/29/2017.          Objective:   Physical Exam  Constitutional: She is oriented to person, place, and time. She appears well-developed and well-nourished.  HENT:  Head: Normocephalic and atraumatic.  Cardiovascular: Normal rate, regular rhythm and normal heart sounds.   Pulmonary/Chest: Effort normal and breath sounds normal.  Musculoskeletal:  Right hip with normal flexion extension rotation. No significant discomfort with internal or external rotation. Nontender over the greater trochanter. Not nontender over the groin crease itself. Having her lie on her back and then raise her leg actually cause discomfort in  the groin crease area. She also has a very weak hip abductors. Knee and ankle strength out of 5 bilaterally.  Neurological: She is alert and oriented to person, place, and time.  Skin: Skin is warm and dry.  Psychiatric: She has a normal mood and affect. Her behavior is normal.          Assessment & Plan:  depression/Anxiety  - Well controlled. Discussed option of decreasing her sertraline down to 100 mg daily. She would like to try that. Otherwise follow-up in 6 months.  Hyperlipidemia-due to repeat lipids.  Right groin strain-no sign of significant osteoarthritis most likely groin strain. Reviewed diagnosis with patient. Given handout on some stretches and exercises to do on her own. She also has very weak hip abductors so I did encourage her to start working  on leg lifts as well. I'll up if not improving over the next 3-4 weeks.  BMI 30-she has done fantastic job on her weight loss. Previously 198 pounds now down to 181.

## 2017-02-08 DIAGNOSIS — Z1231 Encounter for screening mammogram for malignant neoplasm of breast: Secondary | ICD-10-CM | POA: Diagnosis not present

## 2017-02-08 LAB — HM MAMMOGRAPHY

## 2017-02-19 ENCOUNTER — Encounter: Payer: Self-pay | Admitting: Family Medicine

## 2017-03-26 ENCOUNTER — Other Ambulatory Visit: Payer: Self-pay | Admitting: Family Medicine

## 2017-03-26 DIAGNOSIS — M797 Fibromyalgia: Secondary | ICD-10-CM

## 2017-05-11 DIAGNOSIS — Z01419 Encounter for gynecological examination (general) (routine) without abnormal findings: Secondary | ICD-10-CM | POA: Diagnosis not present

## 2017-05-11 DIAGNOSIS — Z6829 Body mass index (BMI) 29.0-29.9, adult: Secondary | ICD-10-CM | POA: Diagnosis not present

## 2017-07-28 DIAGNOSIS — E7800 Pure hypercholesterolemia, unspecified: Secondary | ICD-10-CM | POA: Diagnosis not present

## 2017-07-28 LAB — COMPLETE METABOLIC PANEL WITH GFR
AG Ratio: 2 (calc) (ref 1.0–2.5)
ALKALINE PHOSPHATASE (APISO): 59 U/L (ref 33–130)
ALT: 8 U/L (ref 6–29)
AST: 12 U/L (ref 10–35)
Albumin: 4.4 g/dL (ref 3.6–5.1)
BUN: 20 mg/dL (ref 7–25)
CO2: 28 mmol/L (ref 20–32)
CREATININE: 0.9 mg/dL (ref 0.50–0.99)
Calcium: 9.5 mg/dL (ref 8.6–10.4)
Chloride: 104 mmol/L (ref 98–110)
GFR, Est African American: 79 mL/min/{1.73_m2} (ref >=60)
GFR, Est Non African American: 69 mL/min/{1.73_m2} (ref >=60)
GLOBULIN: 2.2 g/dL (ref 1.9–3.7)
Glucose, Bld: 95 mg/dL (ref 65–99)
POTASSIUM: 4.3 mmol/L (ref 3.5–5.3)
SODIUM: 140 mmol/L (ref 135–146)
Total Bilirubin: 0.4 mg/dL (ref 0.2–1.2)
Total Protein: 6.6 g/dL (ref 6.1–8.1)

## 2017-07-28 LAB — LIPID PANEL
CHOL/HDL RATIO: 4.1 (calc) (ref <5.0)
CHOLESTEROL: 275 mg/dL — AB (ref <200)
HDL: 67 mg/dL (ref >50)
LDL Cholesterol (Calc): 181 mg/dL (calc) — ABNORMAL HIGH
Non-HDL Cholesterol (Calc): 208 mg/dL (calc) — ABNORMAL HIGH (ref <130)
Triglycerides: 134 mg/dL (ref <150)

## 2017-07-30 ENCOUNTER — Ambulatory Visit: Payer: BLUE CROSS/BLUE SHIELD | Admitting: Family Medicine

## 2017-07-30 ENCOUNTER — Encounter: Payer: Self-pay | Admitting: Family Medicine

## 2017-07-30 VITALS — BP 134/81 | HR 76 | Temp 97.8°F | Wt 179.8 lb

## 2017-07-30 DIAGNOSIS — Z6829 Body mass index (BMI) 29.0-29.9, adult: Secondary | ICD-10-CM

## 2017-07-30 DIAGNOSIS — F341 Dysthymic disorder: Secondary | ICD-10-CM | POA: Diagnosis not present

## 2017-07-30 DIAGNOSIS — E7800 Pure hypercholesterolemia, unspecified: Secondary | ICD-10-CM | POA: Diagnosis not present

## 2017-07-30 MED ORDER — SERTRALINE HCL 50 MG PO TABS: 50.0000 mg | ORAL_TABLET | Freq: Every day | ORAL | 2 refills | Status: DC

## 2017-07-30 NOTE — Progress Notes (Signed)
Subjective:    CC: 170-month follow-up  HPI:  Follow-up for depression/anxiety-doing well overall.  She denies feeling down depressed or hopeless and denies feeling little interest and pleasure doing anything.  She is currently on Zoloft 100 mg daily.  Is been doing very well on this decreased dose.  But her mother actually passed away this past week.  [place] little over a week before she passed away.  And she felt like her mom was at peace with it.  She had metastatic breast cancer.  Follow-up hyperlipidemia-her last LDL was quite elevated.  She has been trying to work on her weight loss over this last year.  Is doing great with weight loss.  She started at 202 pounds in January 2018 and is now down to 179 pounds.  She is lost 2 more pounds since she was last here.   Past medical history, Surgical history, Family history not pertinant except as noted below, Social history, Allergies, and medications have been entered into the medical record, reviewed, and corrections made.   Review of Systems: No fevers, chills, night sweats, weight loss, chest pain, or shortness of breath.   Objective:    General: Well Developed, well nourished, and in no acute distress.  Neuro: Alert and oriented x3, extra-ocular muscles intact, sensation grossly intact.  HEENT: Normocephalic, atraumatic  Skin: Warm and dry, no rashes. Cardiac: Regular rate and rhythm, no murmurs rubs or gallops, no lower extremity edema.  Respiratory: Clear to auscultation bilaterally. Not using accessory muscles, speaking in full sentences.   Impression and Recommendations:    Depression/anxiety-PHQ 9 score of 5 though first 2 questions are negative.  And gad 7 score of 2.  To much discussion she would actually like to decrease her Zoloft will drop down to 50 mg.  Follow-up in 6 months.  Hyperlipidemia-lipids were up slightly but she plans on getting back on track with diet and  exercise.  BMI 29-she is doing a great job.  She is down 2 more pounds.  She says that she plans to get back on track even more.

## 2018-01-18 DIAGNOSIS — H40013 Open angle with borderline findings, low risk, bilateral: Secondary | ICD-10-CM | POA: Diagnosis not present

## 2018-01-31 ENCOUNTER — Encounter: Payer: Self-pay | Admitting: Family Medicine

## 2018-01-31 ENCOUNTER — Ambulatory Visit: Payer: BLUE CROSS/BLUE SHIELD | Admitting: Family Medicine

## 2018-01-31 VITALS — BP 117/70 | HR 66 | Ht 65.0 in | Wt 194.0 lb

## 2018-01-31 DIAGNOSIS — Z23 Encounter for immunization: Secondary | ICD-10-CM

## 2018-01-31 DIAGNOSIS — Z6832 Body mass index (BMI) 32.0-32.9, adult: Secondary | ICD-10-CM

## 2018-01-31 DIAGNOSIS — M797 Fibromyalgia: Secondary | ICD-10-CM | POA: Diagnosis not present

## 2018-01-31 DIAGNOSIS — F341 Dysthymic disorder: Secondary | ICD-10-CM

## 2018-01-31 DIAGNOSIS — E7800 Pure hypercholesterolemia, unspecified: Secondary | ICD-10-CM

## 2018-01-31 MED ORDER — CYCLOBENZAPRINE HCL 5 MG PO TABS: ORAL_TABLET | ORAL | 1 refills | Status: DC

## 2018-01-31 MED ORDER — SERTRALINE HCL 50 MG PO TABS: 50.0000 mg | ORAL_TABLET | Freq: Every day | ORAL | 2 refills | Status: DC

## 2018-01-31 NOTE — Progress Notes (Signed)
Subjective:    CC: Mood  HPI:  62 year old female comes in today to follow-up for depression and anxiety.  She is currently on sertraline 50 mg daily.  Follow-up fibromyalgia-currently using 5 mg of Flexeril at bedtime as needed. She is doing oK.  She has been struggling with sleep some.  She says she will feel extremely tired and want to go to bed but by the time she gets ready and actually gets in bed and she is awake again in.  She is just thinking about things like the next day and to do list.  Not necessarily stress for worrisome things.  She has really struggled with her weight over the summer.  She said she really got off track and with stress eating.  More recently starting on the 18th of the month she decided to start a new weight loss program, that is faith-based and really works on reducing emotional eating.  Past medical history, Surgical history, Family history not pertinant except as noted below, Social history, Allergies, and medications have been entered into the medical record, reviewed, and corrections made.   Review of Systems: No fevers, chills, night sweats, weight loss, chest pain, or shortness of breath.   Objective:    General: Well Developed, well nourished, and in no acute distress.  Neuro: Alert and oriented x3, extra-ocular muscles intact, sensation grossly intact.  HEENT: Normocephalic, atraumatic  Skin: Warm and dry, no rashes. Cardiac: Regular rate and rhythm, no murmurs rubs or gallops, no lower extremity edema.  Respiratory: Clear to auscultation bilaterally. Not using accessory muscles, speaking in full sentences.   Impression and Recommendations:    Depression/anxiety-continue current regimen with sertraline 50 mg daily.  We discussed the option of possibly increasing up to 75 mg if needed but she will think about it and let me know.  Her PHQ 9 score was 9 and her gad 7 score was 3.  Follow-up in 6 months.  Fibromyalgia -go ahead and refill her  Flexeril to take at bedtime as needed.  Obesity/BMI 32-she has gained about 15 pounds since I last saw her in March.  She is now on a weight loss regimen that is faith-based.  Just encouraged her to continue to work on healthy diet and staying active and exercising.  Also working on stress reduction.

## 2018-04-18 ENCOUNTER — Other Ambulatory Visit: Payer: Self-pay | Admitting: Family Medicine

## 2018-04-18 DIAGNOSIS — M797 Fibromyalgia: Secondary | ICD-10-CM

## 2018-08-01 ENCOUNTER — Encounter: Payer: Self-pay | Admitting: Family Medicine

## 2018-08-01 ENCOUNTER — Other Ambulatory Visit: Payer: Self-pay

## 2018-08-01 ENCOUNTER — Ambulatory Visit (INDEPENDENT_AMBULATORY_CARE_PROVIDER_SITE_OTHER): Payer: BLUE CROSS/BLUE SHIELD | Admitting: Family Medicine

## 2018-08-01 ENCOUNTER — Encounter: Payer: BLUE CROSS/BLUE SHIELD | Admitting: Family Medicine

## 2018-08-01 VITALS — BP 136/87 | HR 74 | Temp 97.6°F | Ht 65.0 in | Wt 205.0 lb

## 2018-08-01 DIAGNOSIS — R635 Abnormal weight gain: Secondary | ICD-10-CM

## 2018-08-01 DIAGNOSIS — E669 Obesity, unspecified: Secondary | ICD-10-CM | POA: Diagnosis not present

## 2018-08-01 DIAGNOSIS — F341 Dysthymic disorder: Secondary | ICD-10-CM | POA: Diagnosis not present

## 2018-08-01 DIAGNOSIS — M797 Fibromyalgia: Secondary | ICD-10-CM

## 2018-08-01 NOTE — Progress Notes (Signed)
Virtual Visit via Telephone Note  I connected with Cristal Deer on 08/01/18 at 10:10 AM EDT by telephone and verified that I am speaking with the correct person using two identifiers.   I discussed the limitations, risks, security and privacy concerns of performing an evaluation and management service by telephone and the availability of in person appointments. I also discussed with the patient that there may be a patient responsible charge related to this service. The patient expressed understanding and agreed to proceed.     Subjective:    CC: she is concerned about weight gain.   HPI:  F/U depression/Anxiety - she is currently on Zoloft.  She is doing well. We had decreased her dose last fall. Her husband is going to get an electric wheelchair and she feels that is going to help him be more independent and that will allow her to continue to work.    She started a weight loss program at the beginnig of the month for about 3 weeks but actually gained a pound. She was doing KETO which she had done before and did well with so can't figure out why she didn't lose weight.  She is feeling really frustrated.  The is thinking about intermittan fasting as well.    Fibromyalgia - she is doing well. She has a muscle relaxer to use PRN.  Marland Kitchen    Past medical history, Surgical history, Family history not pertinant except as noted below, Social history, Allergies, and medications have been entered into the medical record, reviewed, and corrections made.   Review of Systems: No fevers, chills, night sweats, weight loss, chest pain, or shortness of breath.   Objective:    General: Speaking clearly in complete sentences without any shortness of breath.  Alert and oriented x3.  Normal judgment. No apparent acute distress.   Impression and Recommendations:    Depression/Anxiety - continue with zoloft at current dose. She wants to stay at current dose. Work on stress reduction  F/U in 6 months.  Call  sooner if feels would like to increase her dose back to 100mg .    Obesity/BMI 34 - Recommend a smart phone app like My Fitness Pal or Lose it. She thinks she may try intermittant fasting as well. Work on regular exercise.  He says she will try to get her husband on board as well also encouraged her to try to get out since she is working from home and try to stretch and exercise and get into her yard.  Fibromyalgia -overall doing well right now no recent flares or exacerbations she does have a muscle relaxer as needed encouraged her to reach out if she feels like she struggling at all.  Time spent 22 min on the phone.      I discussed the assessment and treatment plan with the patient. The patient was provided an opportunity to ask questions and all were answered. The patient agreed with the plan and demonstrated an understanding of the instructions.   The patient was advised to call back or seek an in-person evaluation if the symptoms worsen or if the condition fails to improve as anticipated.   Nani Gasser, MD

## 2018-08-01 NOTE — Progress Notes (Signed)
Called pt to obtain her VS prior to her webex visit. lvm asking her to rtn call or to have this information for Dr. Linford Arnold when her appt begins.Laureen Ochs, Viann Shove, CMA

## 2018-12-11 ENCOUNTER — Other Ambulatory Visit: Payer: Self-pay | Admitting: Family Medicine

## 2018-12-11 DIAGNOSIS — M797 Fibromyalgia: Secondary | ICD-10-CM

## 2019-01-02 ENCOUNTER — Other Ambulatory Visit: Payer: Self-pay | Admitting: Family Medicine

## 2019-01-02 DIAGNOSIS — F341 Dysthymic disorder: Secondary | ICD-10-CM

## 2019-03-03 ENCOUNTER — Other Ambulatory Visit: Payer: Self-pay

## 2019-03-03 ENCOUNTER — Ambulatory Visit: Payer: BC Managed Care – PPO | Admitting: Family Medicine

## 2019-03-03 ENCOUNTER — Encounter: Payer: Self-pay | Admitting: Family Medicine

## 2019-03-03 VITALS — BP 118/67 | HR 79 | Ht 65.0 in | Wt 199.0 lb

## 2019-03-03 DIAGNOSIS — Z23 Encounter for immunization: Secondary | ICD-10-CM

## 2019-03-03 DIAGNOSIS — Z1231 Encounter for screening mammogram for malignant neoplasm of breast: Secondary | ICD-10-CM

## 2019-03-03 DIAGNOSIS — Z780 Asymptomatic menopausal state: Secondary | ICD-10-CM | POA: Insufficient documentation

## 2019-03-03 DIAGNOSIS — N809 Endometriosis, unspecified: Secondary | ICD-10-CM | POA: Insufficient documentation

## 2019-03-03 DIAGNOSIS — Z1211 Encounter for screening for malignant neoplasm of colon: Secondary | ICD-10-CM | POA: Diagnosis not present

## 2019-03-03 DIAGNOSIS — Z833 Family history of diabetes mellitus: Secondary | ICD-10-CM

## 2019-03-03 DIAGNOSIS — E7800 Pure hypercholesterolemia, unspecified: Secondary | ICD-10-CM

## 2019-03-03 DIAGNOSIS — F341 Dysthymic disorder: Secondary | ICD-10-CM | POA: Diagnosis not present

## 2019-03-03 MED ORDER — SERTRALINE HCL 100 MG PO TABS: 100.0000 mg | ORAL_TABLET | Freq: Every day | ORAL | 0 refills | Status: DC

## 2019-03-03 NOTE — Progress Notes (Signed)
Established Patient Office Visit  Subjective:  Patient ID: Carolyn [name], female    DOB: [birth year]  Age: 63 y.o. MRN: [id number]  CC:  Chief Complaint  Patient presents with  . mood    she would like to discuss increase the dosage of the Sertraline    HPI Donnalyn [name] presents for follow-up depression/anxiety - she wanys to discuss increasing her dose of sertraline.  She reports little interest or pleasure doing things several days a week and feeling down more than half the days.  She also reports increased levels of irritability more than half the time as well as difficulty relaxing and feeling afraid things might happen.  She denies any thoughts of being better off dead or wanting to harm herself.  She hasn't been sleeping well. Having to help take care of her husband. Has been working from home.    Hyperlipidemia - tolerating stating well with no myalgias or significant side effects.  Lab Results  Component Value Date   CHOL 275 (H) 07/28/2017   CHOL 239 (H) 10/17/2015   CHOL 300 (H) 03/14/2014   Lab Results  Component Value Date   HDL 67 07/28/2017   HDL 66 10/17/2015   HDL 63 03/14/2014   Lab Results  Component Value Date   LDLCALC 181 (H) 07/28/2017   LDLCALC 145 (H) 10/17/2015   LDLCALC 198 (H) 03/14/2014   Lab Results  Component Value Date   TRIG 134 07/28/2017   TRIG 142 10/17/2015   TRIG 193 (H) 03/14/2014   Lab Results  Component Value Date   CHOLHDL 4.1 07/28/2017   CHOLHDL 3.6 10/17/2015   CHOLHDL 4.8 03/14/2014   No results found for: LDLDIRECT   History reviewed. No pertinent past medical history.  Past Surgical History:  Procedure Laterality Date  . ABDOMINAL HYSTERECTOMY  10/90   has her left ovary  . TONSILLECTOMY  15    Family History  Problem Relation Age of Onset  . Cancer Other        breast  . Diabetes Father   . Heart attack Brother   . Breast cancer Mother [id number]  . Lung cancer Brother     Social History   Socioeconomic  History  . Marital status: Married    Spouse name: Not on file  . Number of children: Not on file  . Years of education: Not on file  . Highest education level: Not on file  Occupational History  . Not on file  Social Needs  . Financial resource strain: Not on file  . Food insecurity    Worry: Not on file    Inability: Not on file  . Transportation needs    Medical: Not on file    Non-medical: Not on file  Tobacco Use  . Smoking status: Never Smoker  . Smokeless tobacco: Never Used  Substance and Sexual Activity  . Alcohol use: No  . Drug use: No  . Sexual activity: Not on file  Lifestyle  . Physical activity    Days per week: Not on file    Minutes per session: Not on file  . Stress: Not on file  Relationships  . Social Musicianconnections    Talks on phone: Not on file    Gets together: Not on file    Attends religious service: Not on file    Active member of club or organization: Not on file    Attends meetings of clubs or organizations: Not on file  Relationship status: Not on file  . Intimate partner violence    Fear of current or ex partner: Not on file    Emotionally abused: Not on file    Physically abused: Not on file    Forced sexual activity: Not on file  Other Topics Concern  . Not on file  Social History Narrative  . Not on file    Outpatient Medications Prior to Visit  Medication Sig Dispense Refill  . cyclobenzaprine (FLEXERIL) 5 MG tablet TAKE 1 OR 2 TABLETS DAILY AT BEDTIME AS NEEDED FOR MUSCLE SPASMS 60 tablet 1  . cycloSPORINE (RESTASIS) 0.05 % ophthalmic emulsion 1 drop 2 (two) times daily.    . sertraline (ZOLOFT) 50 MG tablet Take 1 tablet (50 mg total) by mouth daily. 90 tablet 2  . diclofenac (VOLTAREN) 75 MG EC tablet TAKE ONE TABLET TWICE DAILY AS NEEDED 60 tablet 2   No facility-administered medications prior to visit.     No Known Allergies  ROS Review of Systems    Objective:    Physical Exam  Constitutional: She is oriented to  person, place, and time. She appears well-developed and well-nourished.  HENT:  Head: Normocephalic and atraumatic.  Cardiovascular: Normal rate, regular rhythm and normal heart sounds.  Pulmonary/Chest: Effort normal and breath sounds normal.  Neurological: She is alert and oriented to person, place, and time.  Skin: Skin is warm and dry.  Psychiatric: She has a normal mood and affect. Her behavior is normal.    BP 118/67   Pulse 79   Ht 5\' 5"  (1.651 m)   Wt 199 lb (90.3 kg)   SpO2 98%   BMI 33.12 kg/m  Wt Readings from Last 3 Encounters:  03/03/19 199 lb (90.3 kg)  08/01/18 205 lb (93 kg)  01/31/18 194 lb (88 kg)     Health Maintenance Due  Topic Date Due  . MAMMOGRAM  02/09/2019    There are no preventive care reminders to display for this patient.  Lab Results  Component Value Date   TSH 1.115 07/17/2009   Lab Results  Component Value Date   WBC 7.4 10/06/2012   HGB 14.9 10/06/2012   HCT 42.5 10/06/2012   MCV 85.7 10/06/2012   PLT 349 10/06/2012   Lab Results  Component Value Date   NA 140 07/28/2017   K 4.3 07/28/2017   CO2 28 07/28/2017   GLUCOSE 95 07/28/2017   BUN 20 07/28/2017   CREATININE 0.90 07/28/2017   BILITOT 0.4 07/28/2017   ALKPHOS 53 10/17/2015   AST 12 07/28/2017   ALT 8 07/28/2017   PROT 6.6 07/28/2017   ALBUMIN 4.4 10/17/2015   CALCIUM 9.5 07/28/2017   Lab Results  Component Value Date   CHOL 275 (H) 07/28/2017   Lab Results  Component Value Date   HDL 67 07/28/2017   Lab Results  Component Value Date   LDLCALC 181 (H) 07/28/2017   Lab Results  Component Value Date   TRIG 134 07/28/2017   Lab Results  Component Value Date   CHOLHDL 4.1 07/28/2017   No results found for: HGBA1C    Assessment & Plan:   Problem List Items Addressed This Visit      Other   Hyperlipidemia    Due to recheck lipids.      Relevant Orders   COMPLETE METABOLIC PANEL WITH GFR   Lipid panel   Hemoglobin A1c   DEPRESSION/ANXIETY -  Primary    Ok doing well on  current regimen but she would like to increase dose. Will inc to 100mg  of sertraline. F/U in 4-6 mo.      Relevant Medications   sertraline (ZOLOFT) 100 MG tablet    Other Visit Diagnoses    Colon cancer screening       Need for immunization against influenza       Relevant Orders   Flu Vaccine QUAD 36+ mos IM (Completed)   Screening mammogram, encounter for       Relevant Orders   MM 3D SCREEN BREAST BILATERAL   Family history of diabetes mellitus       Relevant Orders   Hemoglobin A1c     Last Cologuard was actually 3 years ago and was negative.  Discussed need for repeat colon cancer screening and discussed options.  Fam hx of DM - will check A1C.   Meds ordered this encounter  Medications  . sertraline (ZOLOFT) 100 MG tablet    Sig: Take 1 tablet (100 mg total) by mouth daily.    Dispense:  90 tablet    Refill:  0    This prescription was filled on 10/14/2018. Any refills authorized will be placed on file.    Follow-up: Return in about 2 months (around 05/03/2019) for virtual visit for increase dose of medication.    05/05/2019, MD

## 2019-03-06 NOTE — Assessment & Plan Note (Signed)
Due to recheck lipids. 

## 2019-03-06 NOTE — Assessment & Plan Note (Addendum)
Ok doing well on current regimen but she would like to increase dose. Will inc to 100mg  of sertraline. F/U in 4-6 mo.

## 2019-04-13 DIAGNOSIS — H40013 Open angle with borderline findings, low risk, bilateral: Secondary | ICD-10-CM | POA: Diagnosis not present

## 2019-05-03 ENCOUNTER — Encounter: Payer: Self-pay | Admitting: Family Medicine

## 2019-05-03 ENCOUNTER — Telehealth (INDEPENDENT_AMBULATORY_CARE_PROVIDER_SITE_OTHER): Payer: BC Managed Care – PPO | Admitting: Family Medicine

## 2019-05-03 DIAGNOSIS — F341 Dysthymic disorder: Secondary | ICD-10-CM

## 2019-05-03 DIAGNOSIS — E785 Hyperlipidemia, unspecified: Secondary | ICD-10-CM | POA: Diagnosis not present

## 2019-05-03 DIAGNOSIS — F418 Other specified anxiety disorders: Secondary | ICD-10-CM | POA: Diagnosis not present

## 2019-05-03 DIAGNOSIS — E7800 Pure hypercholesterolemia, unspecified: Secondary | ICD-10-CM

## 2019-05-03 MED ORDER — SERTRALINE HCL 100 MG PO TABS: 100.0000 mg | ORAL_TABLET | Freq: Every day | ORAL | 1 refills | Status: DC

## 2019-05-03 NOTE — Progress Notes (Signed)
Virtual Visit via Video Note  I connected with Carolyn [name] on 05/03/19 at  8:30 AM EST by a video enabled telemedicine application and verified that I am speaking with the correct person using two identifiers.   I discussed the limitations of evaluation and management by telemedicine and the availability of in person appointments. The patient expressed understanding and agreed to proceed.  Subjective:    CC: 3-month follow-up for medication change  HPI: Follow-up depression/anxiety-we increased her dose of sertraline at last visit.  She had noticed that she was just feeling more down and having some increased levels of irritability in particular. Upset and tearful today bc they are having to put their dog to sleep this week. He is older and has medical problems.   She was also due for follow-up labs for hyperlipidemia and abnormal glucose at last office visit but has not had time to go to the lab.  Past medical history, Surgical history, Family history not pertinant except as noted below, Social history, Allergies, and medications have been entered into the medical record, reviewed, and corrections made.   Review of Systems: No fevers, chills, night sweats, weight loss, chest pain, or shortness of breath.   Objective:    General: Speaking clearly in complete sentences without any shortness of breath.  Alert and oriented x3.  Normal judgment. No apparent acute distress.    Impression and Recommendations:    Depression/anxiety-overall doing well except for her dog not doing well but she has been very happy with results.  She previously been on sertraline 100 and had tried to come down for a while but feels like this seems to be working well and would like to stick with it.  Plan will be to follow back up in about 4 months.    Hyperlipidemia - reminded to go for her labs.   I discussed the assessment and treatment plan with the patient. The patient was provided an opportunity to ask  questions and all were answered. The patient agreed with the plan and demonstrated an understanding of the instructions.   The patient was advised to call back or seek an in-person evaluation if the symptoms worsen or if the condition fails to improve as anticipated.   Beatrice Lecher, MD

## 2019-05-03 NOTE — Progress Notes (Signed)
Called pt several times no answer or VM.  She reports doing well on the increase of the medication.

## 2019-07-14 ENCOUNTER — Other Ambulatory Visit: Payer: Self-pay | Admitting: Family Medicine

## 2019-07-14 DIAGNOSIS — M797 Fibromyalgia: Secondary | ICD-10-CM

## 2019-11-07 ENCOUNTER — Other Ambulatory Visit: Payer: Self-pay | Admitting: Family Medicine

## 2019-11-07 DIAGNOSIS — M797 Fibromyalgia: Secondary | ICD-10-CM

## 2019-11-17 ENCOUNTER — Emergency Department
Admission: EM | Admit: 2019-11-17 | Discharge: 2019-11-17 | Disposition: A | Discharge status: Home / Self Care | Payer: BC Managed Care – PPO | Source: Home / Self Care | Attending: Emergency Medicine | Admitting: Emergency Medicine

## 2019-11-17 ENCOUNTER — Other Ambulatory Visit: Payer: Self-pay

## 2019-11-17 ENCOUNTER — Encounter: Payer: Self-pay | Admitting: Emergency Medicine

## 2019-11-17 ENCOUNTER — Telehealth: Payer: Self-pay

## 2019-11-17 DIAGNOSIS — T7840XA Allergy, unspecified, initial encounter: Secondary | ICD-10-CM | POA: Diagnosis not present

## 2019-11-17 DIAGNOSIS — S40861A Insect bite (nonvenomous) of right upper arm, initial encounter: Secondary | ICD-10-CM

## 2019-11-17 DIAGNOSIS — W57XXXA Bitten or stung by nonvenomous insect and other nonvenomous arthropods, initial encounter: Secondary | ICD-10-CM

## 2019-11-17 MED ORDER — HYDROXYZINE HCL 25 MG PO TABS: ORAL_TABLET | ORAL | 0 refills | Status: DC

## 2019-11-17 MED ORDER — METHYLPREDNISOLONE ACETATE 80 MG/ML IJ SUSP
80.0000 mg | Freq: Once | INTRAMUSCULAR | Status: AC
  Administered 2019-11-17: 80 mg via INTRAMUSCULAR

## 2019-11-17 MED ORDER — PREDNISONE 20 MG PO TABS: 20.0000 mg | ORAL_TABLET | Freq: Two times a day (BID) | ORAL | 0 refills | Status: DC

## 2019-11-17 NOTE — ED Triage Notes (Signed)
Cellulitis Rt upper arm, itches and hurts x 3 days

## 2019-11-17 NOTE — Discharge Instructions (Signed)
Based on history and physical exam, you have a severe local allergic reaction to some insect bites right upper arm.-No sign of infection. Treating you with steroid shot of Depo-Medrol here in urgent care.  May apply cool compresses at home. Sent prescriptions to your pharmacy for hydroxyzine, for itch.  And prednisone, start taking by mouth tomorrow morning as prescribed. Please read attached instruction sheets. If any severe worsening symptoms, seek medical attention immediately.

## 2019-11-17 NOTE — Telephone Encounter (Signed)
Patient calling office stating  she has been bitten by an insect not sure if it is a spider, patient says her arm is red inflamed and itching. Please advise patient on next steps.

## 2019-11-17 NOTE — ED Provider Notes (Addendum)
[name]    CSN: [id number] Arrival date & time: 11/17/19  1458      History   Chief Complaint Chief Complaint  Patient presents with  . Cellulitis    HPI Carolyn [name] is a 64 y.o. female.   HPI 3 days ago, she felt like some kind of insect bit right upper arm.  She did not see what kind of insect because it flew away.  Then over the next hour, progressive swelling, severe itch, and soreness right upper arm.  Not improved with OTC hydrocortisone.  Cream. No fluctuance or bleeding or drainage.  No red streaks.  No swollen lymph nodes or pain in axilla. Denies any new soaps or detergents or other allergens.  No lip or facial swelling or dysphagia or shortness of breath or wheezing. No fever or chills or nausea or vomiting or abdominal pain or chest pain. No ENT symptoms or coryza.  Pertinent items noted in HPI and remainder of comprehensive ROS otherwise negative.   History reviewed. No pertinent past medical history.  Patient Active Problem List   Diagnosis Date Noted  . Menopause present 03/03/2019  . Endometriosis 03/03/2019  . Abnormal weight gain 01/30/2016  . Dry eye syndrome 01/18/2015  . Hyperlipidemia 05/15/2013  . Neck pain, musculoskeletal 06/13/2012  . Obesity (BMI 30.0-34.9) 12/10/2011  . VITAMIN D DEFICIENCY 06/04/2010  . DEPRESSION/ANXIETY 04/02/2010  . OVERACTIVE BLADDER 07/31/2009  . Fibromyalgia 07/17/2009  . FOOT PAIN, BILATERAL 07/17/2009  . STRESS INCONTINENCE 07/17/2009    Past Surgical History:  Procedure Laterality Date  . ABDOMINAL HYSTERECTOMY  10/90   has her left ovary  . TONSILLECTOMY  15    OB History   No obstetric history on file.      Home Medications    Prior to Admission medications   Medication Sig Start Date End Date Taking? Authorizing Provider  cyclobenzaprine (FLEXERIL) 5 MG tablet TAKE 1 OR 2 TABLETS BY MOUTH DAILY AT BEDTIME AS NEEDED FOR MUSCLE SPASMS 07/14/19   Agapito Games, MD    cycloSPORINE (RESTASIS) 0.05 % ophthalmic emulsion 1 drop 2 (two) times daily.    [provider]  hydrOXYzine (ATARAX/VISTARIL) 25 MG tablet Take 1 at bedtime as needed for itch. May cause drowsiness. 11/17/19   Lajean Manes, MD  predniSONE (DELTASONE) 20 MG tablet Take 1 tablet (20 mg total) by mouth 2 (two) times daily with a meal. X 5 days.-Start taking Saturday 7/17 11/17/19   Lajean Manes, MD  sertraline (ZOLOFT) 100 MG tablet Take 1 tablet (100 mg total) by mouth daily. 05/03/19   Agapito Games, MD    Family History Family History  Problem Relation Age of Onset  . Cancer Other        breast  . Diabetes Father   . Heart attack Brother   . Breast cancer Mother 22  . Lung cancer Brother     Social History Social History   Tobacco Use  . Smoking status: Never Smoker  . Smokeless tobacco: Never Used  Vaping Use  . Vaping Use: Never used  Substance Use Topics  . Alcohol use: No  . Drug use: No     Allergies   Patient has no known allergies.   Review of Systems Review of Systems   Physical Exam Triage Vital Signs ED Triage Vitals  Enc Vitals Group     BP 11/17/19 1513 131/89     Pulse Rate 11/17/19 1513 87     Resp 11/17/19  1513 18     Temp 11/17/19 1513 98.9 F (37.2 C)     Temp Source 11/17/19 1513 Oral     SpO2 11/17/19 1513 98 %     Weight 11/17/19 1514 210 lb (95.3 kg)     Height 11/17/19 1514 5\' 5"  (1.651 m)     Head Circumference --      Peak Flow --      Pain Score 11/17/19 1514 3     Pain Loc --      Pain Edu? --      Excl. in GC? --    No data found.  Updated Vital Signs BP 131/89 (BP Location: Right Arm)   Pulse 87   Temp 98.9 F (37.2 C) (Oral)   Resp 18   Ht 5\' 5"  (1.651 m)   Wt 95.3 kg   SpO2 98%   BMI 34.95 kg/m   Visual Acuity Right Eye Distance:   Left Eye Distance:   Bilateral Distance:    Right Eye Near:   Left Eye Near:    Bilateral Near:     Physical Exam Vitals and nursing note reviewed.   Constitutional:      General: She is not in acute distress.    Appearance: She is well-developed. She is not ill-appearing, toxic-appearing or diaphoretic.     Comments: Uncomfortable from pruritic redness and swelling right upper arm  HENT:     Head: Normocephalic and atraumatic.     Mouth/Throat:     Comments: No lip or facial swelling.  Oropharynx clear without swelling.  Airway intact. Eyes:     General: No scleral icterus.    Pupils: Pupils are equal, round, and reactive to light.  Cardiovascular:     Rate and Rhythm: Normal rate and regular rhythm.  Pulmonary:     Effort: Pulmonary effort is normal.     Comments: Lungs clear.  Breath sounds equal.  No wheezing.  O2 saturation 98% room air. Abdominal:     General: There is no distension.  Musculoskeletal:       Arms:     Cervical back: Normal range of motion and neck supple.     Comments: No axillary adenopathy or tenderness. Distally right upper extremity, neurovascular intact.  Skin:    General: Skin is warm and dry.     Capillary Refill: Capillary refill takes less than 2 seconds.  Neurological:     General: No focal deficit present.     Mental Status: She is alert and oriented to person, place, and time.     Cranial Nerves: No cranial nerve deficit.  Psychiatric:        Behavior: Behavior normal.    No bony tenderness.  Full range of motion of upper extremities.  UC Treatments / Results  Labs (all labs ordered are listed, but only abnormal results are displayed) Labs Reviewed - No data to display  EKG   Radiology No results found.  Procedures Procedures (including critical care time)  Medications Ordered in UC Medications  methylPREDNISolone acetate (DEPO-MEDROL) injection 80 mg (80 mg Intramuscular Given 11/17/19 1549)    Initial Impression / Assessment and Plan / UC Course  I have reviewed the triage vital signs and the nursing notes.  Pertinent labs & imaging results that were available during  my care of the patient were reviewed by me and considered in my medical decision making (see chart for details).      Final Clinical Impressions(s) / UC Diagnoses  Final diagnoses:  Allergic reaction, initial encounter  Insect bite of right upper arm, initial encounter     Discharge Instructions     Based on history and physical exam, you have a severe local allergic reaction to some insect bites right upper arm.-No sign of infection. Treating you with steroid shot of Depo-Medrol here in urgent care.  May apply cool compresses at home. Sent prescriptions to your pharmacy for hydroxyzine, for itch.  And prednisone, start taking by mouth tomorrow morning as prescribed. Please read attached instruction sheets. If any severe worsening symptoms, seek medical attention immediately.    ED Prescriptions    Medication Sig Dispense Auth. Provider   hydrOXYzine (ATARAX/VISTARIL) 25 MG tablet Take 1 at bedtime as needed for itch. May cause drowsiness. 10 tablet Lajean Manes, MD   predniSONE (DELTASONE) 20 MG tablet Take 1 tablet (20 mg total) by mouth 2 (two) times daily with a meal. X 5 days.-Start taking Saturday 7/17 10 tablet Lajean Manes, MD     Precautions discussed. Red flags discussed. Questions invited and answered. Patient voiced understanding and agreement.    Lajean Manes, MD 11/22/19 1419    Lajean Manes, MD 11/22/19 1420

## 2019-11-20 NOTE — Telephone Encounter (Signed)
If not improving needs appt. Can use topical cortisone

## 2019-11-22 NOTE — Telephone Encounter (Signed)
Per pt, affected area is doing much better. Pt was unable to stay on the phone long due to her husband having surgery at time of call. No other inquiries during call.

## 2020-01-06 ENCOUNTER — Other Ambulatory Visit: Payer: Self-pay | Admitting: Family Medicine

## 2020-01-06 DIAGNOSIS — F341 Dysthymic disorder: Secondary | ICD-10-CM

## 2020-01-17 ENCOUNTER — Encounter: Payer: Self-pay | Admitting: Family Medicine

## 2020-01-29 ENCOUNTER — Telehealth: Payer: Self-pay

## 2020-01-29 DIAGNOSIS — R635 Abnormal weight gain: Secondary | ICD-10-CM

## 2020-01-29 NOTE — Telephone Encounter (Signed)
Nashika would like a referral [facility].

## 2020-01-29 NOTE — Telephone Encounter (Signed)
Order signed.

## 2020-02-21 ENCOUNTER — Ambulatory Visit (INDEPENDENT_AMBULATORY_CARE_PROVIDER_SITE_OTHER): Payer: Self-pay | Admitting: Family Medicine

## 2020-02-22 ENCOUNTER — Encounter (INDEPENDENT_AMBULATORY_CARE_PROVIDER_SITE_OTHER): Payer: Self-pay | Admitting: Family Medicine

## 2020-02-22 ENCOUNTER — Ambulatory Visit (INDEPENDENT_AMBULATORY_CARE_PROVIDER_SITE_OTHER): Payer: BC Managed Care – PPO | Admitting: Family Medicine

## 2020-02-22 ENCOUNTER — Other Ambulatory Visit: Payer: Self-pay

## 2020-02-22 VITALS — BP 124/74 | HR 70 | Temp 98.3°F | Ht 65.0 in | Wt 207.0 lb

## 2020-02-22 DIAGNOSIS — M1990 Unspecified osteoarthritis, unspecified site: Secondary | ICD-10-CM | POA: Insufficient documentation

## 2020-02-22 DIAGNOSIS — R5383 Other fatigue: Secondary | ICD-10-CM | POA: Diagnosis not present

## 2020-02-22 DIAGNOSIS — E669 Obesity, unspecified: Secondary | ICD-10-CM

## 2020-02-22 DIAGNOSIS — E559 Vitamin D deficiency, unspecified: Secondary | ICD-10-CM | POA: Diagnosis not present

## 2020-02-22 DIAGNOSIS — D649 Anemia, unspecified: Secondary | ICD-10-CM

## 2020-02-22 DIAGNOSIS — Z6834 Body mass index (BMI) 34.0-34.9, adult: Secondary | ICD-10-CM

## 2020-02-22 DIAGNOSIS — E7849 Other hyperlipidemia: Secondary | ICD-10-CM

## 2020-02-22 DIAGNOSIS — Z9189 Other specified personal risk factors, not elsewhere classified: Secondary | ICD-10-CM | POA: Diagnosis not present

## 2020-02-22 DIAGNOSIS — F39 Unspecified mood [affective] disorder: Secondary | ICD-10-CM | POA: Diagnosis not present

## 2020-02-22 DIAGNOSIS — Z0289 Encounter for other administrative examinations: Secondary | ICD-10-CM

## 2020-02-22 DIAGNOSIS — R0602 Shortness of breath: Secondary | ICD-10-CM | POA: Insufficient documentation

## 2020-02-22 DIAGNOSIS — F3289 Other specified depressive episodes: Secondary | ICD-10-CM

## 2020-02-23 LAB — FOLATE: Folate: 9.7 ng/mL (ref >3.0)

## 2020-02-23 LAB — COMPREHENSIVE METABOLIC PANEL
ALT: 16 IU/L (ref 0–32)
AST: 16 IU/L (ref 0–40)
Albumin/Globulin Ratio: 1.8 (ref 1.2–2.2)
Albumin: 4.7 g/dL (ref 3.8–4.8)
Alkaline Phosphatase: 80 IU/L (ref 44–121)
BUN/Creatinine Ratio: 12 (ref 12–28)
BUN: 11 mg/dL (ref 8–27)
Bilirubin Total: 0.3 mg/dL (ref 0.0–1.2)
CO2: 21 mmol/L (ref 20–29)
Calcium: 9.8 mg/dL (ref 8.7–10.3)
Chloride: 101 mmol/L (ref 96–106)
Creatinine, Ser: 0.93 mg/dL (ref 0.57–1.00)
GFR calc Af Amer: 75 mL/min/{1.73_m2} (ref >59)
GFR calc non Af Amer: 65 mL/min/{1.73_m2} (ref >59)
Globulin, Total: 2.6 g/dL (ref 1.5–4.5)
Glucose: 101 mg/dL — ABNORMAL HIGH (ref 65–99)
Potassium: 4.3 mmol/L (ref 3.5–5.2)
Sodium: 137 mmol/L (ref 134–144)
Total Protein: 7.3 g/dL (ref 6.0–8.5)

## 2020-02-23 LAB — CBC WITH DIFFERENTIAL/PLATELET
Basophils Absolute: 0.1 10*3/uL (ref 0.0–0.2)
Basos: 1 %
EOS (ABSOLUTE): 0.1 10*3/uL (ref 0.0–0.4)
Eos: 2 %
Hematocrit: 41.8 % (ref 34.0–46.6)
Hemoglobin: 13.5 g/dL (ref 11.1–15.9)
Immature Grans (Abs): 0 10*3/uL (ref 0.0–0.1)
Immature Granulocytes: 0 %
Lymphocytes Absolute: 2.8 10*3/uL (ref 0.7–3.1)
Lymphs: 34 %
MCH: 29.8 pg (ref 26.6–33.0)
MCHC: 32.3 g/dL (ref 31.5–35.7)
MCV: 92 fL (ref 79–97)
Monocytes Absolute: 0.6 10*3/uL (ref 0.1–0.9)
Monocytes: 7 %
Neutrophils Absolute: 4.8 10*3/uL (ref 1.4–7.0)
Neutrophils: 56 %
Platelets: 335 10*3/uL (ref 150–450)
RBC: 4.53 x10E6/uL (ref 3.77–5.28)
RDW: 13.2 % (ref 11.7–15.4)
WBC: 8.5 10*3/uL (ref 3.4–10.8)

## 2020-02-23 LAB — TSH: TSH: 0.974 u[IU]/mL (ref 0.450–4.500)

## 2020-02-23 LAB — VITAMIN B12: Vitamin B-12: 365 pg/mL (ref 232–1245)

## 2020-02-23 LAB — LIPID PANEL
Chol/HDL Ratio: 5.4 ratio — ABNORMAL HIGH (ref 0.0–4.4)
Cholesterol, Total: 316 mg/dL — ABNORMAL HIGH (ref 100–199)
HDL: 59 mg/dL (ref >39)
LDL Chol Calc (NIH): 193 mg/dL — ABNORMAL HIGH (ref 0–99)
Triglycerides: 322 mg/dL — ABNORMAL HIGH (ref 0–149)
VLDL Cholesterol Cal: 64 mg/dL — ABNORMAL HIGH (ref 5–40)

## 2020-02-23 LAB — VITAMIN D 25 HYDROXY (VIT D DEFICIENCY, FRACTURES): Vit D, 25-Hydroxy: 23.9 ng/mL — ABNORMAL LOW (ref 30.0–100.0)

## 2020-02-23 LAB — T3: T3, Total: 131 ng/dL (ref 71–180)

## 2020-02-23 LAB — HEMOGLOBIN A1C
Est. average glucose Bld gHb Est-mCnc: 128 mg/dL
Hgb A1c MFr Bld: 6.1 % — ABNORMAL HIGH (ref 4.8–5.6)

## 2020-02-23 LAB — INSULIN, RANDOM: INSULIN: 12.2 u[IU]/mL (ref 2.6–24.9)

## 2020-02-23 LAB — T4: T4, Total: 8.6 ug/dL (ref 4.5–12.0)

## 2020-02-27 NOTE — Progress Notes (Signed)
Dear Carolyn Carolyn [name],   Thank you for referring Carolyn Carolyn [name] to our clinic. The following note includes my evaluation and treatment recommendations.  Chief Complaint:   OBESITY Carolyn Carolyn [name] (MR# [id number]) is a 64 y.o. female who presents for evaluation and treatment of obesity and related comorbidities. Current BMI is Body mass index is 34.45 kg/m. Carolyn Carolyn [name] has been struggling with her weight for many years and has been unsuccessful in either losing weight, maintaining weight loss, or reaching her healthy weight goal.  Carolyn Carolyn [name] is currently in the action stage of change and ready to dedicate time achieving and maintaining a healthier weight. Carolyn Carolyn [name] is interested in becoming our patient and working on intensive lifestyle modifications including (but not limited to) diet and exercise for weight loss.  Carolyn Carolyn [name] retired from her full time job recently.  She was widowed on 01/25/2020, and lives alone.  She craves salty/cruncy foods such as chips and salsa, pizza, butter, nuts, pretzels, and fruit.  She skips breakfast most days.  Her son-in-law is a patient here.  Carolyn Carolyn [name]'s habits were reviewed today and are as follows: her desired weight loss is 75 pounds, she started gaining weight after having a hysterectomy, her heaviest weight ever was 215 pounds, she craves salty foods like chips and salsa, pizza, baked potato with sour cream and butter, and nuts, she snacks frequently in the evenings, she skips breakfast frequently, she frequently makes poor food choices, she frequently eats larger portions than normal and she struggles with emotional eating.  This is the patient's first visit at [facility].  The patient's NEW PATIENT PACKET that they filled out prior to today's office visit was reviewed at length and information from that paperwork was included within the following office visit note.    Included in the packet: current and past health history, medications, allergies, ROS,  gynecologic history (women only), surgical history, family history, social history, weight history, weight loss surgery history (for those that have had weight loss surgery), nutritional evaluation, Carolyn [name] and food questionnaire along with a depression screening (PHQ9) on all patients, an Epworth questionnaire, sleep habits questionnaire, patient life and health improvement goals questionnaire. These will all be scanned into the patient's chart under media.   During the visit, I independently reviewed the patient's EKG, bioimpedance scale results, and indirect calorimeter results. I used this information to tailor a meal plan for the patient that will help Carolyn Carolyn [name] to lose weight and will improve her obesity-related conditions going forward.  I performed a medically necessary appropriate examination and/or evaluation. I discussed the assessment and treatment plan with the patient. The patient was provided an opportunity to ask questions and all were answered. The patient agreed with the plan and demonstrated an understanding of the instructions. Labs were ordered today (unless patient declined them) and will be reviewed with the patient at our next visit unless more critical results need to be addressed immediately. Clinical information was updated and documented in the EMR.  Time spent on visit including pre-visit chart review and post-visit care was estimated to be 60-74 minutes.  A separate 15 minutes was spent on risk counseling (see above/below).   Depression Screen Carolyn Carolyn [name] (modified PHQ-9) score was 14.  Depression screen PHQ 2/9 02/22/2020  Decreased Interest 1  Down, Depressed, Hopeless 3  PHQ - 2 Score 4  Altered sleeping 2  Tired, decreased energy 2  Change in appetite 2  Feeling bad or failure about yourself  3  Trouble concentrating  1  Moving slowly or fidgety/restless 0  Suicidal thoughts 0  PHQ-9 Score 14  Difficult doing work/chores Not difficult at all  Some recent data  might be hidden   Assessment/Plan:   1. Other fatigue Carolyn Carolyn [name] denies daytime somnolence and admits to waking up still tired. Patent has a history of symptoms of Carolyn Carolyn [name] fatigue and occasional snoring. Carolyn Carolyn [name] generally gets 7 hours of sleep per night, and states that she has poor quality sleep. Snoring is present. Apneic episodes are not present. Epworth Sleepiness Score is 4.  Carolyn Carolyn [name] does feel that her weight is causing her energy to be lower than it should be. Fatigue may be related to obesity, depression or many other causes. Labs will be ordered, and in the meanwhile, Carolyn [name] will focus on self care including making healthy food choices, increasing physical activity and focusing on stress reduction.  - EKG 12-Lead - Vitamin B12 - CBC with Differential/Platelet - Comprehensive metabolic panel - Folate - Hemoglobin A1c - Insulin, random - Lipid panel - T3 - T4 - TSH - VITAMIN D 25 Hydroxy (Vit-D Deficiency, Fractures)  2. SOB (shortness of breath) on exertion Carolyn Carolyn [name] notes increasing shortness of breath with exercising and seems to be worsening over time with weight gain. She notes getting out of breath sooner with activity than she used to. This has gotten worse recently. Carolyn Carolyn [name] denies shortness of breath at rest or orthopnea.  Carolyn Carolyn [name] does feel that she gets out of breath more easily that she used to when she exercises. Carolyn Carolyn [name] shortness of breath appears to be obesity related and exercise induced. She has agreed to work on weight loss and gradually increase exercise to treat her exercise induced shortness of breath. Will continue to monitor closely.  - Vitamin B12 - CBC with Differential/Platelet - Comprehensive metabolic panel - Folate - Hemoglobin A1c - Insulin, random - Lipid panel - T3 - T4 - TSH - VITAMIN D 25 Hydroxy (Vit-D Deficiency, Fractures)  3. Other hyperlipidemia Carolyn Carolyn [name] has hyperlipidemia and has been trying to improve her cholesterol levels with intensive lifestyle  modification including a low saturated fat diet, exercise and weight loss. She denies any chest pain, claudication or myalgias.  She is not on any medication for cholesterol at this time.  Plan:  Will check labs today.  Lab Results  Component Value Date   ALT 16 02/22/2020   AST 16 02/22/2020   ALKPHOS 80 02/22/2020   BILITOT 0.3 02/22/2020   Lab Results  Component Value Date   CHOL 316 (H) 02/22/2020   HDL 59 02/22/2020   LDLCALC 193 (H) 02/22/2020   TRIG 322 (H) 02/22/2020   CHOLHDL 5.4 (H) 02/22/2020   4. Anemia, unspecified type Carolyn Carolyn [name] is not a vegetarian.  She does not have a history of weight loss surgery.  She is not taking an iron supplement.  Plan:  Check labs today.  CBC Latest Ref Rng & Units 02/22/2020 10/06/2012 07/17/2009  WBC 3.4 - 10.8 x10E3/uL 8.5 7.4 6.1  Hemoglobin 11.1 - 15.9 g/dL 55.9 74.1 63.8  Hematocrit 34.0 - 46.6 % 41.8 42.5 42.4  Platelets 150 - 450 x10E3/uL 335 349 270   Lab Results  Component Value Date   VITAMINB12 365 02/22/2020   5. Vitamin D deficiency Carolyn Carolyn [name] has a history of Vitamin D deficiency with resultant generalized fatigue as her primary symptom.  she is taking vitamin D 5,000 IU daily for this deficiency and tolerating it well without side-effect.    Plan:   - Discussed  importance of vitamin D (as well as calcium) to their health and well-being.   - We reviewed possible symptoms of low Vitamin D including low energy, depressed Carolyn [name], muscle aches, joint aches, osteoporosis etc.  - We discussed that low Vitamin D levels may be linked to an increased risk of cardiovascular events and even increased risk of cancers- such as colon and breast.   - Educated pt that weight loss will likely improve availability of vitamin D, thus encouraged Carolyn Carolyn [name] to continue with meal plan and their weight loss efforts to further improve this condition  - I recommend pt continue taking OTC vit D 5,000 IU, which pt agrees to after discussion of risks  and benefits of this medication.  Will check vitamin D level today.  - Informed patient this may be a lifelong thing, and she was encouraged to continue to take the medicine until pt told otherwise.   We will need to monitor levels regularly ( q 3-4 mo on average )  to keep levels within normal limits.   - All pt's questions and concerns regarding this condition addressed  6. Other depression, with emotional eating PHQ-9 is 14.  Carolyn Carolyn [name] also has anxiety.  She is taking Zoloft 100 mg daily.  Denies SI.  Plan:  Continue Zoloft as prescribed.    7. At risk for deficient intake of food Carolyn Carolyn [name] was given approximately 30 minutes of deficit intake of food prevention counseling today. Carolyn Carolyn [name] is at risk for eating too few calories based on current food recall. She was encouraged to focus on meeting caloric and protein goals according to her recommended meal plan.   8. Class 1 obesity with serious comorbidity and body mass index (BMI) of 34.0 to 34.9 in adult, unspecified obesity type  Carolyn Carolyn [name] is currently in the action stage of change and her goal is to continue with weight loss efforts. I recommend Carolyn Carolyn [name] begin the structured treatment plan as follows:  She has agreed to the Category 1 Plan.  Exercise goals: As is.   Behavioral modification strategies: no skipping meals, meal planning and cooking strategies and planning for success.  She was informed of the importance of frequent follow-up visits to maximize her success with intensive lifestyle modifications for her multiple health conditions. She was informed we would discuss her lab results at her next visit unless there is a critical issue that needs to be addressed sooner. Carolyn Carolyn [name] agreed to keep her next visit at the agreed upon time to discuss these results.  Objective:   Blood pressure 124/74, pulse 70, temperature 98.3 F (36.8 C), height 5\' 5"  (1.651 m), weight 207 lb (93.9 kg), SpO2 97 %. Body mass index is 34.45 kg/m.  EKG: Normal sinus  rhythm, rate 71 bpm.  Indirect Calorimeter completed today shows a VO2 of 255 and a REE of 1772.  Her calculated basal metabolic rate is [id number] thus her basal metabolic rate is better than expected.  General: Cooperative, alert, well developed, in no acute distress. HEENT: Conjunctivae and lids unremarkable. Cardiovascular: Regular rhythm.  Lungs: Normal work of breathing. Neurologic: No focal deficits.   Lab Results  Component Value Date   CREATININE 0.93 02/22/2020   BUN 11 02/22/2020   NA 137 02/22/2020   K 4.3 02/22/2020   CL 101 02/22/2020   CO2 21 02/22/2020   Lab Results  Component Value Date   ALT 16 02/22/2020   AST 16 02/22/2020   ALKPHOS 80 02/22/2020   BILITOT 0.3 02/22/2020   Lab Results  Component Value Date   HGBA1C 6.1 (H) 02/22/2020   Lab Results  Component Value Date   INSULIN 12.2 02/22/2020   Lab Results  Component Value Date   TSH 0.974 02/22/2020   Lab Results  Component Value Date   CHOL 316 (H) 02/22/2020   HDL 59 02/22/2020   LDLCALC 193 (H) 02/22/2020   TRIG 322 (H) 02/22/2020   CHOLHDL 5.4 (H) 02/22/2020   Lab Results  Component Value Date   WBC 8.5 02/22/2020   HGB 13.5 02/22/2020   HCT 41.8 02/22/2020   MCV 92 02/22/2020   PLT 335 02/22/2020   Attestation Statements:   Reviewed by clinician on day of visit: allergies, medications, problem list, medical history, surgical history, family history, social history, and previous encounter notes.  I, Insurance claims handler, CMA, am acting as Energy manager for [employer], DO.  I have reviewed the above documentation for accuracy and completeness, and I agree with the above. Carlye Grippe, D.O.  The 21st Century Cures Act was signed into law in 2016 which includes the topic of electronic health records.  This provides immediate access to information in MyChart.  This includes consultation notes, operative notes, office notes, lab results and pathology reports.  If you have any  questions about what you read please let us know at your next visit so we can discuss your concerns and take corrective action if need be.  We are right here with you.

## 2020-03-06 ENCOUNTER — Ambulatory Visit (INDEPENDENT_AMBULATORY_CARE_PROVIDER_SITE_OTHER): Payer: Self-pay | Admitting: Family Medicine

## 2020-03-07 ENCOUNTER — Other Ambulatory Visit: Payer: Self-pay

## 2020-03-07 ENCOUNTER — Ambulatory Visit (INDEPENDENT_AMBULATORY_CARE_PROVIDER_SITE_OTHER): Payer: Medicare Other | Admitting: Family Medicine

## 2020-03-07 VITALS — BP 119/79 | HR 80 | Temp 98.1°F | Ht 65.0 in | Wt 203.0 lb

## 2020-03-07 DIAGNOSIS — Z9189 Other specified personal risk factors, not elsewhere classified: Secondary | ICD-10-CM | POA: Diagnosis not present

## 2020-03-07 DIAGNOSIS — D649 Anemia, unspecified: Secondary | ICD-10-CM | POA: Diagnosis not present

## 2020-03-07 DIAGNOSIS — R7303 Prediabetes: Secondary | ICD-10-CM | POA: Diagnosis not present

## 2020-03-07 DIAGNOSIS — E559 Vitamin D deficiency, unspecified: Secondary | ICD-10-CM | POA: Diagnosis not present

## 2020-03-07 DIAGNOSIS — E7849 Other hyperlipidemia: Secondary | ICD-10-CM | POA: Diagnosis not present

## 2020-03-07 DIAGNOSIS — Z6833 Body mass index (BMI) 33.0-33.9, adult: Secondary | ICD-10-CM

## 2020-03-07 DIAGNOSIS — E669 Obesity, unspecified: Secondary | ICD-10-CM

## 2020-03-07 MED ORDER — VITAMIN D (ERGOCALCIFEROL) 1.25 MG (50000 UNIT) PO CAPS: 50000.0000 [IU] | ORAL_CAPSULE | ORAL | 0 refills | Status: DC

## 2020-03-07 MED ORDER — ROSUVASTATIN CALCIUM 5 MG PO TABS: 5.0000 mg | ORAL_TABLET | Freq: Every day | ORAL | 0 refills | Status: DC

## 2020-03-07 NOTE — Patient Instructions (Signed)
The 10-year ASCVD risk score Denman George DC Montez Hageman., et al., 2013) is: 5.5%   Values used to calculate the score:     Age: 64 years     Sex: Female     Is Non-Hispanic African American: No     Diabetic: No     Tobacco smoker: No     Systolic Blood Pressure: 119 mmHg     Is BP treated: No     HDL Cholesterol: 59 mg/dL     Total Cholesterol: 316 mg/dL

## 2020-03-11 NOTE — Progress Notes (Signed)
Chief Complaint:   OBESITY Carolyn [name] is here to discuss her progress with her obesity treatment plan along with follow-up of her obesity related diagnoses. Carolyn [name] is on the Category 1 Plan and states she is following her eating plan approximately 95% of the time. Carolyn [name] states she is exercising for 0 minutes 0 times per week.  Today's visit was #: 2 Starting weight: 207 lbs Starting date: 02/22/2020 Today's weight: 203 lbs Today's date: 03/07/2020 Total lbs lost to date: 4 lbs Total lbs lost since last in-office visit: 4 lbs Total weight loss percentage to date: -1.93%  Interim History: Carolyn [name] is here for her first follow-up visit today.  She says her family is supportive, and they are making meals together.  She says the plan is easy to follow.  In 2 days, she would miss breakfast.  Otherwise, she gets it all in.  Hunger is controlled.  Cravings are controlled.  For snack calories, she has carrots and additional vegetables or fruits.  Plan:  Start vitamin D, start Crestor.  Assessment/Plan:   1. Prediabetes New.  Discussed labs with patient today.  Carolyn [name] has a diagnosis of prediabetes based on her elevated HgA1c and was informed this puts her at greater risk of developing diabetes. She continues to work on diet and exercise to decrease her risk of diabetes. She denies nausea or hypoglycemia.    Lab Results  Component Value Date   HGBA1C 6.1 (H) 02/22/2020   Lab Results  Component Value Date   INSULIN 12.2 02/22/2020   2. Other hyperlipidemia Worsening.  Discussed labs with patient today.  Carolyn [name] has hyperlipidemia and has been trying to improve her cholesterol levels with intensive lifestyle modification including a low saturated fat diet, exercise and weight loss. She denies any chest pain, claudication or myalgias.  Plan:  Start Crestor 5 mg daily.  Lab Results  Component Value Date   ALT 16 02/22/2020   AST 16 02/22/2020   ALKPHOS 80 02/22/2020   BILITOT 0.3 02/22/2020    Lab Results  Component Value Date   CHOL 316 (H) 02/22/2020   HDL 59 02/22/2020   LDLCALC 193 (H) 02/22/2020   TRIG 322 (H) 02/22/2020   CHOLHDL 5.4 (H) 02/22/2020   -Start rosuvastatin (CRESTOR) 5 MG tablet; Take 1 tablet (5 mg total) by mouth at bedtime.  Dispense: 30 tablet; Refill: 0  3. Vitamin D deficiency New.  Discussed labs with patient today.  Carolyn [name]'s Vitamin D level was 23.9 on 02/22/2020. She is currently taking OTC vitamin D 5,000 IU each day. She denies nausea, vomiting or muscle weakness.  -Start Vitamin D, Ergocalciferol, (DRISDOL) 1.25 MG (50000 UNIT) CAPS capsule; Take 1 capsule (50,000 Units total) by mouth every 7 (seven) days.  Dispense: 4 capsule; Refill: 0  4. Anemia, unspecified type Discussed labs with patient today.  Carolyn [name] is not a vegetarian.  She does not have a history of weight loss surgery.   Plan:  Labs are stable.    CBC Latest Ref Rng & Units 02/22/2020 10/06/2012 07/17/2009  WBC 3.4 - 10.8 x10E3/uL 8.5 7.4 6.1  Hemoglobin 11.1 - 15.9 g/dL 16.1 09.6 04.5  Hematocrit 34.0 - 46.6 % 41.8 42.5 42.4  Platelets 150 - 450 x10E3/uL 335 349 270   Lab Results  Component Value Date   VITAMINB12 365 02/22/2020   5. At risk for diabetes mellitus - Carolyn [name] was given extensive diabetes prevention education and counseling today of more than 31 minutes.  - Counseled patient  on pathophysiology of disease and discussed various treatment options which always includes dietary and lifestyle modification as first line.   - Importance of healthy diet with very limited amounts of simple carbohydrates discussed with patient in addition to regular aerobic exercise to an eventual goal of 5d/week or more.  - Handouts provided at patient's desire and or told to go online at the American Diabetes Association website for further information  6. Class 1 obesity with serious comorbidity and body mass index (BMI) of 33.0 to 33.9 in adult, unspecified obesity type  Carolyn [name] is  currently in the action stage of change. As such, her goal is to continue with weight loss efforts. She has agreed to the Category 1 Plan.   Exercise goals: Older adults should follow the adult guidelines. When older adults cannot meet the adult guidelines, they should be as physically active as their abilities and conditions will allow.  Older adults should do exercises that maintain or improve balance if they are at risk of falling.  Walking for 5-10 minutes per day.  Behavioral modification strategies: decreasing simple carbohydrates, increasing water intake, decreasing sodium intake, no skipping meals, keeping healthy foods in the home and planning for success.  Carolyn [name] has agreed to follow-up with our clinic in 2 weeks. She was informed of the importance of frequent follow-up visits to maximize her success with intensive lifestyle modifications for her multiple health conditions.   Objective:   Blood pressure 119/79, pulse 80, temperature 98.1 F (36.7 C), height 5\' 5"  (1.651 m), weight 203 lb (92.1 kg), SpO2 97 %. Body mass index is 33.78 kg/m.  General: Cooperative, alert, well developed, in no acute distress. HEENT: Conjunctivae and lids unremarkable. Cardiovascular: Regular rhythm.  Lungs: Normal work of breathing. Neurologic: No focal deficits.   Lab Results  Component Value Date   CREATININE 0.93 02/22/2020   BUN 11 02/22/2020   NA 137 02/22/2020   K 4.3 02/22/2020   CL 101 02/22/2020   CO2 21 02/22/2020   Lab Results  Component Value Date   ALT 16 02/22/2020   AST 16 02/22/2020   ALKPHOS 80 02/22/2020   BILITOT 0.3 02/22/2020   Lab Results  Component Value Date   HGBA1C 6.1 (H) 02/22/2020   Lab Results  Component Value Date   INSULIN 12.2 02/22/2020   Lab Results  Component Value Date   TSH 0.974 02/22/2020   Lab Results  Component Value Date   CHOL 316 (H) 02/22/2020   HDL 59 02/22/2020   LDLCALC 193 (H) 02/22/2020   TRIG 322 (H) 02/22/2020   CHOLHDL  5.4 (H) 02/22/2020   Lab Results  Component Value Date   WBC 8.5 02/22/2020   HGB 13.5 02/22/2020   HCT 41.8 02/22/2020   MCV 92 02/22/2020   PLT 335 02/22/2020   Attestation Statements:   Reviewed by clinician on day of visit: allergies, medications, problem list, medical history, surgical history, family history, social history, and previous encounter notes.  I, 02/24/2020, CMA, am acting as Insurance claims handler for Energy manager, DO.  I have reviewed the above documentation for accuracy and completeness, and I agree with the above. [employer], D.O.  The 21st Century Cures Act was signed into law in 2016 which includes the topic of electronic health records.  This provides immediate access to information in MyChart.  This includes consultation notes, operative notes, office notes, lab results and pathology reports.  If you have any questions about what you read please  let us know at your next visit so we can discuss your concerns and take corrective action if need be.  We are right here with you.

## 2020-03-19 ENCOUNTER — Other Ambulatory Visit: Payer: Self-pay

## 2020-03-19 ENCOUNTER — Ambulatory Visit (INDEPENDENT_AMBULATORY_CARE_PROVIDER_SITE_OTHER): Payer: Medicare Other | Admitting: Family Medicine

## 2020-03-19 ENCOUNTER — Encounter (INDEPENDENT_AMBULATORY_CARE_PROVIDER_SITE_OTHER): Payer: Self-pay | Admitting: Family Medicine

## 2020-03-19 VITALS — BP 113/69 | HR 67 | Temp 98.0°F | Ht 65.0 in | Wt 201.0 lb

## 2020-03-19 DIAGNOSIS — R7303 Prediabetes: Secondary | ICD-10-CM | POA: Diagnosis not present

## 2020-03-19 DIAGNOSIS — E7849 Other hyperlipidemia: Secondary | ICD-10-CM

## 2020-03-19 DIAGNOSIS — E559 Vitamin D deficiency, unspecified: Secondary | ICD-10-CM

## 2020-03-19 DIAGNOSIS — E669 Obesity, unspecified: Secondary | ICD-10-CM

## 2020-03-19 DIAGNOSIS — Z6833 Body mass index (BMI) 33.0-33.9, adult: Secondary | ICD-10-CM

## 2020-03-21 ENCOUNTER — Telehealth: Payer: Self-pay

## 2020-03-21 DIAGNOSIS — Z13820 Encounter for screening for osteoporosis: Secondary | ICD-10-CM

## 2020-03-21 DIAGNOSIS — Z1231 Encounter for screening mammogram for malignant neoplasm of breast: Secondary | ICD-10-CM

## 2020-03-21 DIAGNOSIS — Z780 Asymptomatic menopausal state: Secondary | ICD-10-CM

## 2020-03-21 NOTE — Telephone Encounter (Signed)
Donie wanted a Dexa and Mammogram order. Orders placed.

## 2020-03-22 NOTE — Telephone Encounter (Signed)
Agree with documentation as above.   Kwali Wrinkle, MD  

## 2020-03-25 NOTE — Progress Notes (Signed)
Chief Complaint:   OBESITY Carolyn [name] is here to discuss her progress with her obesity treatment plan along with follow-up of her obesity related diagnoses. Carolyn [name] is on the Category 1 Plan and states she is following her eating plan approximately 95% of the time. Carolyn [name] states she is walking for 30 minutes 1 time per week.  Today's visit was #: 3 Starting weight: 207 lbs Starting date: 02/22/2020 Today's weight: 201 lbs Today's date: 03/20/2020 Total lbs lost to date: 6 lbs Total lbs lost since last in-office visit: 2 lbs Total weight loss percentage to date: -2.90%  Interim History:  - Carolyn [name] says that things have been going well with no issues or concerns.  Eating all food on plan.  She had a birthday over the weekend and ate a little off plan.  Hunger and cravings are well controlled.   Assessment/Plan:     1. Other hyperlipidemia Started Crestor at last visit on 03/07/20.  Taking nightly and tolerating well.  She increased her water intake by 10 ounces per day (drinking 72-80 ounces per day).  Plan:  Continue Crestor.  Decrease saturated and trans fats.  Increase exercise as tolerated.  Will continue to monitor.  Lab Results  Component Value Date   ALT 16 02/22/2020   AST 16 02/22/2020   ALKPHOS 80 02/22/2020   BILITOT 0.3 02/22/2020   Lab Results  Component Value Date   CHOL 316 (H) 02/22/2020   HDL 59 02/22/2020   LDLCALC 193 (H) 02/22/2020   TRIG 322 (H) 02/22/2020   CHOLHDL 5.4 (H) 02/22/2020   2. Vitamin D deficiency Carolyn [name]'s Vitamin D level was 23.9 on 02/22/2020. She is currently taking prescription vitamin D 50,000 IU each week. She denies nausea, vomiting or muscle weakness.  Plan:  She will request a referral for bone density. Continue weekly vitamin D.  Recheck level in 3 months.  3. Prediabetes Carolyn [name] has a diagnosis of prediabetes based on her elevated HgA1c and was informed this puts her at greater risk of developing diabetes. She continues to work on  diet and exercise to decrease her risk of diabetes. She denies nausea or hypoglycemia.  She says she read her handouts on prediabetes that were given to her at last office visit.  No questions.  Plan:  Consider medication to help with weight loss as needed in the future.  Decrease simple carbs, increase protein.  Recheck labs every 3 months or so.  Lab Results  Component Value Date   HGBA1C 6.1 (H) 02/22/2020   Lab Results  Component Value Date   INSULIN 12.2 02/22/2020   4. Class 1 obesity with serious comorbidity and body mass index (BMI) of 33.0 to 33.9 in adult, unspecified obesity type  Carolyn [name] is currently in the action stage of change. As such, her goal is to continue with weight loss efforts. She has agreed to the Category 1 Plan.   Exercise goals: As is.  Behavioral modification strategies: meal planning and cooking strategies, holiday eating strategies  and celebration eating strategies.  Carolyn [name] has agreed to follow-up with our clinic in 2 weeks. She was informed of the importance of frequent follow-up visits to maximize her success with intensive lifestyle modifications for her multiple health conditions.   Objective:   Blood pressure 113/69, pulse 67, temperature 98 F (36.7 C), height 5\' 5"  (1.651 m), weight 201 lb (91.2 kg), SpO2 96 %. Body mass index is 33.45 kg/m.  General: Cooperative, alert, well developed, in no acute  distress. HEENT: Conjunctivae and lids unremarkable. Cardiovascular: Regular rhythm.  Lungs: Normal work of breathing. Neurologic: No focal deficits.   Lab Results  Component Value Date   CREATININE 0.93 02/22/2020   BUN 11 02/22/2020   NA 137 02/22/2020   K 4.3 02/22/2020   CL 101 02/22/2020   CO2 21 02/22/2020   Lab Results  Component Value Date   ALT 16 02/22/2020   AST 16 02/22/2020   ALKPHOS 80 02/22/2020   BILITOT 0.3 02/22/2020   Lab Results  Component Value Date   HGBA1C 6.1 (H) 02/22/2020   Lab Results  Component Value  Date   INSULIN 12.2 02/22/2020   Lab Results  Component Value Date   TSH 0.974 02/22/2020   Lab Results  Component Value Date   CHOL 316 (H) 02/22/2020   HDL 59 02/22/2020   LDLCALC 193 (H) 02/22/2020   TRIG 322 (H) 02/22/2020   CHOLHDL 5.4 (H) 02/22/2020   Lab Results  Component Value Date   WBC 8.5 02/22/2020   HGB 13.5 02/22/2020   HCT 41.8 02/22/2020   MCV 92 02/22/2020   PLT 335 02/22/2020   Obesity Behavioral Intervention:   Approximately 15 minutes were spent on the discussion below.  ASK: We discussed the diagnosis of obesity with Mava today and Yamile agreed to give [name] permission to discuss obesity behavioral modification therapy today.  ASSESS: Tyne has the diagnosis of obesity and her BMI today is 33.4. Annmarie is in the action stage of change.   ADVISE: Pearle was educated on the multiple health risks of obesity as well as the benefit of weight loss to improve her health. She was advised of the need for long term treatment and the importance of lifestyle modifications to improve her current health and to decrease her risk of future health problems.  AGREE: Multiple dietary modification options and treatment options were discussed and Anoushka agreed to follow the recommendations documented in the above note.  ARRANGE: Lynnsie was educated on the importance of frequent visits to treat obesity as outlined per CMS and USPSTF guidelines and agreed to schedule her next follow up appointment today.  Attestation Statements:   Reviewed by clinician on day of visit: allergies, medications, problem list, medical history, surgical history, family history, social history, and previous encounter notes.  I, Insurance claims handler, CMA, am acting as Energy manager for [employer], DO.  I have reviewed the above documentation for accuracy and completeness, and I agree with the above. Carlye Grippe, D.O.  The 21st Century Cures Act was signed into law in 2016 which includes  the topic of electronic health records.  This provides immediate access to information in MyChart.  This includes consultation notes, operative notes, office notes, lab results and pathology reports.  If you have any questions about what you read please let us know at your next visit so we can discuss your concerns and take corrective action if need be.  We are right here with you.

## 2020-04-01 ENCOUNTER — Encounter: Payer: Self-pay | Admitting: Family Medicine

## 2020-04-01 ENCOUNTER — Ambulatory Visit (INDEPENDENT_AMBULATORY_CARE_PROVIDER_SITE_OTHER): Payer: Medicare Other | Admitting: Family Medicine

## 2020-04-01 VITALS — BP 115/72 | HR 74 | Temp 98.2°F | Ht 65.0 in | Wt 202.0 lb

## 2020-04-01 DIAGNOSIS — R7303 Prediabetes: Secondary | ICD-10-CM | POA: Diagnosis not present

## 2020-04-01 DIAGNOSIS — Z23 Encounter for immunization: Secondary | ICD-10-CM

## 2020-04-01 DIAGNOSIS — E782 Mixed hyperlipidemia: Secondary | ICD-10-CM

## 2020-04-01 DIAGNOSIS — F341 Dysthymic disorder: Secondary | ICD-10-CM

## 2020-04-01 MED ORDER — AMBULATORY NON FORMULARY MEDICATION: 0 refills | Status: DC

## 2020-04-01 MED ORDER — SERTRALINE HCL 25 MG PO TABS: 25.0000 mg | ORAL_TABLET | Freq: Every day | ORAL | 1 refills | Status: DC

## 2020-04-01 MED ORDER — SERTRALINE HCL 100 MG PO TABS: 100.0000 mg | ORAL_TABLET | Freq: Every day | ORAL | 0 refills | Status: DC

## 2020-04-01 NOTE — Assessment & Plan Note (Signed)
Actively working on healthy lifestyle changes and weight loss.  I think this will hopefully come back down into the normal range.  Recommend recheck in 3 to 4 months.

## 2020-04-01 NOTE — Progress Notes (Signed)
Established Patient Office Visit  Subjective:  Patient ID: Carolyn [name], female    DOB: 09/07/1955  Age: 64 y.o. MRN: [id number]  CC:  Chief Complaint  Patient presents with  . Hyperglycemia  . Hyperlipidemia    HPI Carolyn [name] presents for   Impaired fasting glucose-no increased thirst or urination. No symptoms consistent with hypoglycemia.  Last hemoglobin A1c was 6.1 in October.  Hyperlipidemia - tolerating stating well with no myalgias or significant side effects.  LDL was significantly elevated at 193 with total cholesterol 316. Lab Results  Component Value Date   CHOL 316 (H) 02/22/2020   HDL 59 02/22/2020   LDLCALC 193 (H) 02/22/2020   TRIG 322 (H) 02/22/2020   CHOLHDL 5.4 (H) 02/22/2020   She has been going to healthy weight and wellness for the last month to really focus on losing weight she is actually already down about 6 pounds.  But did want to discuss some of her lab results including her A1c and her lipids.  She also wanted to give me an update her husband who she has been the primary caretaker for, passed away in January 14, 2023 and she also retired she is she has more time to focus on herself overall she is doing well.  Still dealing with a lot of the financial changes after her husband's passing but she does feel like she is doing okay.  He has scheduled her mammogram and bone density for January.   Past Medical History:  Diagnosis Date  . Anemia   . Anxiety   . Chest pain   . Chronic pain of both feet   . Depression   . Fibromyalgia   . Flat feet   . High cholesterol   . Joint pain   . Knee pain, bilateral   . Obesity   . Osteoarthritis    neck  . SOB (shortness of breath) on exertion   . Vitamin D deficiency     Past Surgical History:  Procedure Laterality Date  . TONSILLECTOMY  15  . TOTAL ABDOMINAL HYSTERECTOMY  10/90   has her left ovary    Family History  Problem Relation Age of Onset  . Cancer Other        breast  . Diabetes  Father   . Heart disease Father   . Obesity Father   . Heart attack Brother   . Breast cancer Mother 57  . Cancer Mother   . Lung cancer Brother     Social History   Socioeconomic History  . Marital status: Widowed    Spouse name: Not on file  . Number of children: Not on file  . Years of education: Not on file  . Highest education level: Not on file  Occupational History  . Occupation: Retired  Tobacco Use  . Smoking status: Never Smoker  . Smokeless tobacco: Never Used  Vaping Use  . Vaping Use: Never used  Substance and Sexual Activity  . Alcohol use: No  . Drug use: No  . Sexual activity: Not on file  Other Topics Concern  . Not on file  Social History Narrative  . Not on file   Social Determinants of Health   Financial Resource Strain:   . Difficulty of Paying Living Expenses: Not on file  Food Insecurity:   . Worried About Programme researcher, broadcasting/film/video in the Last Year: Not on file  . Ran Out of Food in the Last Year: Not on file  Transportation Needs:   .  Lack of Transportation (Medical): Not on file  . Lack of Transportation (Non-Medical): Not on file  Physical Activity:   . Days of Exercise per Week: Not on file  . Minutes of Exercise per Session: Not on file  Stress:   . Feeling of Stress : Not on file  Social Connections:   . Frequency of Communication with Friends and Family: Not on file  . Frequency of Social Gatherings with Friends and Family: Not on file  . Attends Religious Services: Not on file  . Active Member of Clubs or Organizations: Not on file  . Attends Banker Meetings: Not on file  . Marital Status: Not on file  Intimate Partner Violence:   . Fear of Current or Ex-Partner: Not on file  . Emotionally Abused: Not on file  . Physically Abused: Not on file  . Sexually Abused: Not on file    Outpatient Medications Prior to Visit  Medication Sig Dispense Refill  . Ascorbic Acid (VITAMIN C PO) Take 1,000 mg by mouth daily.    .  Cholecalciferol (VITAMIN D-3 PO) Take 5,000 Units by mouth daily.    . cyclobenzaprine (FLEXERIL) 5 MG tablet TAKE 1 OR 2 TABLETS BY MOUTH DAILY AT BEDTIME AS NEEDED FOR MUSCLE SPASMS 60 tablet 1  . cycloSPORINE (RESTASIS) 0.05 % ophthalmic emulsion 1 drop 2 (two) times daily.    . Multiple Vitamins-Minerals (ZINC PO) Take by mouth. 22 mg/800 mg daily    . rosuvastatin (CRESTOR) 5 MG tablet Take 1 tablet (5 mg total) by mouth at bedtime. 30 tablet 0  . Vitamin D, Ergocalciferol, (DRISDOL) 1.25 MG (50000 UNIT) CAPS capsule Take 1 capsule (50,000 Units total) by mouth every 7 (seven) days. 4 capsule 0  . sertraline (ZOLOFT) 100 MG tablet Take 1 tablet (100 mg total) by mouth daily. 30 DAY SUPPLY.PLEASE CALL OFFICE TO SCHEDULE APPOINTMENT FOR REFILLS 30 tablet 0   No facility-administered medications prior to visit.    No Known Allergies  ROS Review of Systems    Objective:    Physical Exam Constitutional:      Appearance: She is well-developed.  HENT:     Head: Normocephalic and atraumatic.  Cardiovascular:     Rate and Rhythm: Normal rate and regular rhythm.     Heart sounds: Normal heart sounds.  Pulmonary:     Effort: Pulmonary effort is normal.     Breath sounds: Normal breath sounds.  Skin:    General: Skin is warm and dry.  Neurological:     Mental Status: She is alert and oriented to person, place, and time.  Psychiatric:        Behavior: Behavior normal.     BP 115/72   Pulse 74   Temp 98.2 F (36.8 C) (Oral)   Ht 5\' 5"  (1.651 m)   Wt 202 lb (91.6 kg)   SpO2 100% Comment: on RA  BMI 33.61 kg/m  Wt Readings from Last 3 Encounters:  04/01/20 202 lb (91.6 kg)  03/19/20 201 lb (91.2 kg)  03/07/20 203 lb (92.1 kg)     Health Maintenance Due  Topic Date Due  . Fecal DNA (Cologuard)  09/25/2018  . MAMMOGRAM  02/09/2019  . TETANUS/TDAP  08/29/2019  . DEXA SCAN  Never done  . PNA vac Low Risk Adult (1 of 2 - PCV13) 03/16/2020    There are no preventive  care reminders to display for this patient.  Lab Results  Component Value Date  TSH 0.974 02/22/2020   Lab Results  Component Value Date   WBC 8.5 02/22/2020   HGB 13.5 02/22/2020   HCT 41.8 02/22/2020   MCV 92 02/22/2020   PLT 335 02/22/2020   Lab Results  Component Value Date   NA 137 02/22/2020   K 4.3 02/22/2020   CO2 21 02/22/2020   GLUCOSE 101 (H) 02/22/2020   BUN 11 02/22/2020   CREATININE 0.93 02/22/2020   BILITOT 0.3 02/22/2020   ALKPHOS 80 02/22/2020   AST 16 02/22/2020   ALT 16 02/22/2020   PROT 7.3 02/22/2020   ALBUMIN 4.7 02/22/2020   CALCIUM 9.8 02/22/2020   Lab Results  Component Value Date   CHOL 316 (H) 02/22/2020   Lab Results  Component Value Date   HDL 59 02/22/2020   Lab Results  Component Value Date   LDLCALC 193 (H) 02/22/2020   Lab Results  Component Value Date   TRIG 322 (H) 02/22/2020   Lab Results  Component Value Date   CHOLHDL 5.4 (H) 02/22/2020   Lab Results  Component Value Date   HGBA1C 6.1 (H) 02/22/2020      Assessment & Plan:   Problem List Items Addressed This Visit      Other   Prediabetes    Actively working on healthy lifestyle changes and weight loss.  I think this will hopefully come back down into the normal range.  Recommend recheck in 3 to 4 months.      Mixed hyperlipidemia    Thus far tolerating new dose of statin well.  She has been extremely resistant in the past because her husband had significant side effects with statins.  But so far she is doing well she has been on it for about 2 weeks to strongly encouraged her to continue with that explained how the medication works and what the long-term benefits are including reduction for risk of stroke and heart attack.      DEPRESSION/ANXIETY    Shee is actually doing really well and feels like a lot of stress has really been taken off her plate.  Discussed that after the holidays okay to decrease sertraline to 75 mg for 1 month, 50 mg for 1 month, and  then 25 for 1 month and then we can taper off completely at that point.  Plan to follow-up in 3 months.      Relevant Medications   sertraline (ZOLOFT) 25 MG tablet   sertraline (ZOLOFT) 100 MG tablet    Other Visit Diagnoses    Need for influenza vaccination    -  Primary   Relevant Orders   Flu Vaccine QUAD High Dose(Fluad) (Completed)   Need for diphtheria-tetanus-pertussis (Tdap) vaccine       Relevant Medications   AMBULATORY NON FORMULARY MEDICATION     Given printed rx for Tdap and Shingles vaccine.    Meds ordered this encounter  Medications  . AMBULATORY NON FORMULARY MEDICATION    Sig: Medication Name: Shingrix IM x 1. Repeat in 2-6 months.    Dispense:  1 Units    Refill:  0  . AMBULATORY NON FORMULARY MEDICATION    Sig: Medication Name: Tdap IM x 1    Dispense:  1 Units    Refill:  0  . sertraline (ZOLOFT) 25 MG tablet    Sig: Take 1 tablet (25 mg total) by mouth daily.    Dispense:  30 tablet    Refill:  1  . sertraline (ZOLOFT) 100  MG tablet    Sig: Take 1 tablet (100 mg total) by mouth daily.    Dispense:  90 tablet    Refill:  0    Follow-up: Return in about 3 months (around 07/01/2020) for Medication changes .    Nani Gasser, MD

## 2020-04-01 NOTE — Assessment & Plan Note (Signed)
Thus far tolerating new dose of statin well.  She has been extremely resistant in the past because her husband had significant side effects with statins.  But so far she is doing well she has been on it for about 2 weeks to strongly encouraged her to continue with that explained how the medication works and what the long-term benefits are including reduction for risk of stroke and heart attack.

## 2020-04-01 NOTE — Patient Instructions (Signed)
Starting January 1 recommend decreasing your sertraline to half a tab of the 100+ a 25 mg together daily for a total of 75 mg.  Continue 75 mg daily for 30 days, then decrease down to 50 mg(1/2 of the 100 mg tab) for 30 days.  Then take 25 mg daily for 30 days.  When she get to this point if you are doing well then please let me know and we can taper off completely.

## 2020-04-01 NOTE — Assessment & Plan Note (Addendum)
Shee is actually doing really well and feels like a lot of stress has really been taken off her plate.  Discussed that after the holidays okay to decrease sertraline to 75 mg for 1 month, 50 mg for 1 month, and then 25 for 1 month and then we can taper off completely at that point.  Plan to follow-up in 3 months.

## 2020-04-03 ENCOUNTER — Ambulatory Visit (INDEPENDENT_AMBULATORY_CARE_PROVIDER_SITE_OTHER): Payer: Medicare Other | Admitting: Family Medicine

## 2020-04-03 ENCOUNTER — Other Ambulatory Visit: Payer: Self-pay

## 2020-04-03 ENCOUNTER — Encounter (INDEPENDENT_AMBULATORY_CARE_PROVIDER_SITE_OTHER): Payer: Self-pay | Admitting: Family Medicine

## 2020-04-03 VITALS — BP 103/69 | HR 69 | Temp 98.0°F | Ht 65.0 in | Wt 198.0 lb

## 2020-04-03 DIAGNOSIS — E669 Obesity, unspecified: Secondary | ICD-10-CM

## 2020-04-03 DIAGNOSIS — E7849 Other hyperlipidemia: Secondary | ICD-10-CM

## 2020-04-03 DIAGNOSIS — Z6833 Body mass index (BMI) 33.0-33.9, adult: Secondary | ICD-10-CM | POA: Diagnosis not present

## 2020-04-03 DIAGNOSIS — E559 Vitamin D deficiency, unspecified: Secondary | ICD-10-CM

## 2020-04-03 MED ORDER — VITAMIN D (ERGOCALCIFEROL) 1.25 MG (50000 UNIT) PO CAPS: 50000.0000 [IU] | ORAL_CAPSULE | ORAL | 0 refills | Status: DC

## 2020-04-03 MED ORDER — ROSUVASTATIN CALCIUM 10 MG PO TABS: 10.0000 mg | ORAL_TABLET | Freq: Every day | ORAL | 0 refills | Status: DC

## 2020-04-04 NOTE — Progress Notes (Signed)
Chief Complaint:   OBESITY Carolyn [name] is here to discuss her progress with her obesity treatment plan along with follow-up of her obesity related diagnoses. Carolyn [name] is on the Category 1 Plan and states she is following her eating plan approximately 75% of the time. Carolyn [name] states she is exercising 0 minutes 0 times per week.  Today's visit was #: 4 Starting weight: 207 lbs Starting date: 02/22/2020 Today's weight: 198 lbs Today's date: 04/03/2020 Total lbs lost to date: 9 lbs Total lbs lost since last in-office visit: 3 lbs Total weight loss percentage to date: -4.35%  Interim History: Carolyn [name] was seen by her PCP recently. Her notes were reviewed and she denies issues.  Plan: I provided Carolyn [name] with handouts on protein content of food and protein equivalents.  Assessment/Plan:   1. Other hyperlipidemia Saya was prescribed Crestor 5 mg on 03/07/2020. She is tolerating it well andis okay with increasing it to 10 mg.  Plan: Increase Crestor form 5 mg to 10 mg every night. New script for 1 month provided.  New Script/Change dose- rosuvastatin (CRESTOR) 10 MG tablet; Take 1 tablet (10 mg total) by mouth at bedtime.  Dispense: 30 tablet; Refill: 0  2. Vitamin D deficiency Carolyn [name]'s Vitamin D level was 23.9 on 02/22/2020. She is currently taking prescription vitamin D 50,000 IU each week. She denies nausea, vomiting or muscle weakness.  Plan: Refill Vit D for 1 month, as per below.  Refill- Vitamin D, Ergocalciferol, (DRISDOL) 1.25 MG (50000 UNIT) CAPS capsule; Take 1 capsule (50,000 Units total) by mouth every 7 (seven) days.  Dispense: 4 capsule; Refill: 0  3. Class 1 obesity with serious comorbidity and body mass index (BMI) of 33.0 to 33.9 in adult, unspecified obesity type Carolyn [name] is currently in the action stage of change. As such, her goal is to continue with weight loss efforts. She has agreed to the Category 1 Plan.   Exercise goals: Start 5 minutes 3 days a week of exercise. Older  adults should follow the adult guidelines. When older adults cannot meet the adult guidelines, they should be as physically active as their abilities and conditions will allow.   Behavioral modification strategies: increasing lean protein intake, decreasing simple carbohydrates, meal planning and cooking strategies and planning for success.  Carolyn [name] has agreed to follow-up with our clinic in 2-3 weeks. She was informed of the importance of frequent follow-up visits to maximize her success with intensive lifestyle modifications for her multiple health conditions.   Objective:   Blood pressure 103/69, pulse 69, temperature 98 F (36.7 C), height 5\' 5"  (1.651 m), weight 198 lb (89.8 kg), SpO2 98 %. Body mass index is 32.95 kg/m.  General: Cooperative, alert, well developed, in no acute distress. HEENT: Conjunctivae and lids unremarkable. Cardiovascular: Regular rhythm.  Lungs: Normal work of breathing. Neurologic: No focal deficits.   Lab Results  Component Value Date   CREATININE 0.93 02/22/2020   BUN 11 02/22/2020   NA 137 02/22/2020   K 4.3 02/22/2020   CL 101 02/22/2020   CO2 21 02/22/2020   Lab Results  Component Value Date   ALT 16 02/22/2020   AST 16 02/22/2020   ALKPHOS 80 02/22/2020   BILITOT 0.3 02/22/2020   Lab Results  Component Value Date   HGBA1C 6.1 (H) 02/22/2020   Lab Results  Component Value Date   INSULIN 12.2 02/22/2020   Lab Results  Component Value Date   TSH 0.974 02/22/2020   Lab Results  Component  Value Date   CHOL 316 (H) 02/22/2020   HDL 59 02/22/2020   LDLCALC 193 (H) 02/22/2020   TRIG 322 (H) 02/22/2020   CHOLHDL 5.4 (H) 02/22/2020   Lab Results  Component Value Date   WBC 8.5 02/22/2020   HGB 13.5 02/22/2020   HCT 41.8 02/22/2020   MCV 92 02/22/2020   PLT 335 02/22/2020   No results found for: IRON, TIBC, FERRITIN  Obesity Behavioral Intervention:   Approximately 15 minutes were spent on the discussion below.  ASK: We  discussed the diagnosis of obesity with Carolyn [name] today and Carolyn [name] agreed to give [name] permission to discuss obesity behavioral modification therapy today.  ASSESS: Carolyn [name] has the diagnosis of obesity and her BMI today is 33. Carolyn [name] is in the action stage of change.   ADVISE: Carolyn [name] was educated on the multiple health risks of obesity as well as the benefit of weight loss to improve her health. She was advised of the need for long term treatment and the importance of lifestyle modifications to improve her current health and to decrease her risk of future health problems.  AGREE: Multiple dietary modification options and treatment options were discussed and Carolyn [name] agreed to follow the recommendations documented in the above note.  ARRANGE: Carolyn [name] was educated on the importance of frequent visits to treat obesity as outlined per CMS and USPSTF guidelines and agreed to schedule her next follow up appointment today.  Attestation Statements:   Reviewed by clinician on day of visit: allergies, medications, problem list, medical history, surgical history, family history, social history, and previous encounter notes.  Edmund Hilda, am acting as Energy manager for [employer], DO.  I have reviewed the above documentation for accuracy and completeness, and I agree with the above. Carlye Grippe, D.O.  The 21st Century Cures Act was signed into law in 2016 which includes the topic of electronic health records.  This provides immediate access to information in MyChart.  This includes consultation notes, operative notes, office notes, lab results and pathology reports.  If you have any questions about what you read please let us know at your next visit so we can discuss your concerns and take corrective action if need be.  We are right here with you.

## 2020-04-22 ENCOUNTER — Ambulatory Visit (INDEPENDENT_AMBULATORY_CARE_PROVIDER_SITE_OTHER): Payer: Medicare Other | Admitting: Family Medicine

## 2020-04-22 ENCOUNTER — Encounter (INDEPENDENT_AMBULATORY_CARE_PROVIDER_SITE_OTHER): Payer: Self-pay | Admitting: Family Medicine

## 2020-04-22 ENCOUNTER — Other Ambulatory Visit: Payer: Self-pay

## 2020-04-22 VITALS — BP 105/66 | HR 78 | Temp 98.0°F | Ht 65.0 in | Wt 193.0 lb

## 2020-04-22 DIAGNOSIS — Z6832 Body mass index (BMI) 32.0-32.9, adult: Secondary | ICD-10-CM

## 2020-04-22 DIAGNOSIS — E66811 Obesity, class 1: Secondary | ICD-10-CM

## 2020-04-22 DIAGNOSIS — E559 Vitamin D deficiency, unspecified: Secondary | ICD-10-CM | POA: Diagnosis not present

## 2020-04-22 DIAGNOSIS — Z9189 Other specified personal risk factors, not elsewhere classified: Secondary | ICD-10-CM

## 2020-04-22 DIAGNOSIS — E7849 Other hyperlipidemia: Secondary | ICD-10-CM | POA: Diagnosis not present

## 2020-04-22 DIAGNOSIS — E669 Obesity, unspecified: Secondary | ICD-10-CM | POA: Diagnosis not present

## 2020-04-22 MED ORDER — VITAMIN D (ERGOCALCIFEROL) 1.25 MG (50000 UNIT) PO CAPS: 50000.0000 [IU] | ORAL_CAPSULE | ORAL | 0 refills | Status: DC

## 2020-04-22 MED ORDER — ROSUVASTATIN CALCIUM 10 MG PO TABS: 10.0000 mg | ORAL_TABLET | Freq: Every day | ORAL | 0 refills | Status: DC

## 2020-04-23 NOTE — Progress Notes (Signed)
Chief Complaint:   OBESITY Carolyn [name] is here to discuss her progress with her obesity treatment plan along with follow-up of her obesity related diagnoses. Carolyn [name] is on the Category 1 Plan and states she is following her eating plan approximately 95% of the time. Patches states she is walking 5-10 minutes 3 times per week.  Today's visit was #: 5 Starting weight: 207 lbs Starting date: 02/22/2020 Today's weight: 193 lbs Today's date: 04/22/2020 Total lbs lost to date: 14 lbs Total lbs lost since last in-office visit: 5 lbs Total weight loss percentage to date: -6.76%  Interim History: Carolyn [name] reports her hunger and cravings are controlled. She does occasionally skip foods but not often. She states she is improving her relationship with food each passing week.  Assessment/Plan:   1. Other hyperlipidemia We increased Ellaree's Crestor dose at last OV. She is tolerating it well without issues. Syra drinks 70-80 oz of water a day.  Carolyn [name] has hyperlipidemia and has been trying to improve her cholesterol levels with intensive lifestyle modification including a low saturated fat diet, exercise and weight loss. She denies any chest pain, claudication or myalgias.  Lab Results  Component Value Date   ALT 16 02/22/2020   AST 16 02/22/2020   ALKPHOS 80 02/22/2020   BILITOT 0.3 02/22/2020   Lab Results  Component Value Date   CHOL 316 (H) 02/22/2020   HDL 59 02/22/2020   LDLCALC 193 (H) 02/22/2020   TRIG 322 (H) 02/22/2020   CHOLHDL 5.4 (H) 02/22/2020   Plan: Refill Crestor for 1 month, as per below. Recheck ALT and fasting lipid panel in February or March 2022. Cardiovascular risk and specific lipid/LDL goals reviewed.  We discussed several lifestyle modifications today and Carolyn [name] will continue to work on diet, exercise and weight loss efforts. Orders and follow up as documented in patient record.   Counseling Intensive lifestyle modifications are the first line treatment for this  issue. . Dietary changes: Increase soluble fiber. Decrease simple carbohydrates. . Exercise changes: Moderate to vigorous-intensity aerobic activity 150 minutes per week if tolerated. . Lipid-lowering medications: see documented in medical record.  Refill- rosuvastatin (CRESTOR) 10 MG tablet; Take 1 tablet (10 mg total) by mouth at bedtime.  Dispense: 30 tablet; Refill: 0  2. Vitamin D deficiency Carolyn [name]'s Vitamin D level was 23.9 on 02/22/2020. She is currently taking prescription vitamin D 50,000 IU each week. She denies nausea, vomiting or muscle weakness.   Ref. Range 02/22/2020 10:50  Vitamin D, 25-Hydroxy Latest Ref Range: 30.0 - 100.0 ng/mL 23.9 (L)   Refill- Vitamin D, Ergocalciferol, (DRISDOL) 1.25 MG (50000 UNIT) CAPS capsule; Take 1 capsule (50,000 Units total) by mouth every 7 (seven) days.  Dispense: 4 capsule; Refill: 0  3. At risk for side effect of medication Carolyn [name] was given approximately 15 minutes of drug side effect counseling today.  We discussed side effect possibility and risk versus benefits. Carolyn [name] agreed to the medication and will contact this office if these side effects are intolerable.  4. Class 1 obesity with serious comorbidity and body mass index (BMI) of 32.0 to 32.9 in adult, unspecified obesity type Carolyn [name] is currently in the action stage of change. As such, her goal is to continue with weight loss efforts. She has agreed to the Category 1 Plan.   Exercise goals: Increase walking to 20 minutes 5 days a week.  Behavioral modification strategies: increasing water intake, holiday eating strategies  and celebration eating strategies.  Carolyn [name] has agreed to  follow-up with our clinic in 2-3 weeks. She was informed of the importance of frequent follow-up visits to maximize her success with intensive lifestyle modifications for her multiple health conditions.   Objective:   Blood pressure 105/66, pulse 78, temperature 98 F (36.7 C), height 5\' 5"  (1.651 m), SpO2 98  %. Body mass index is 32.95 kg/m.  General: Cooperative, alert, well developed, in no acute distress. HEENT: Conjunctivae and lids unremarkable. Cardiovascular: Regular rhythm.  Lungs: Normal work of breathing. Neurologic: No focal deficits.   Lab Results  Component Value Date   CREATININE 0.93 02/22/2020   BUN 11 02/22/2020   NA 137 02/22/2020   K 4.3 02/22/2020   CL 101 02/22/2020   CO2 21 02/22/2020   Lab Results  Component Value Date   ALT 16 02/22/2020   AST 16 02/22/2020   ALKPHOS 80 02/22/2020   BILITOT 0.3 02/22/2020   Lab Results  Component Value Date   HGBA1C 6.1 (H) 02/22/2020   Lab Results  Component Value Date   INSULIN 12.2 02/22/2020   Lab Results  Component Value Date   TSH 0.974 02/22/2020   Lab Results  Component Value Date   CHOL 316 (H) 02/22/2020   HDL 59 02/22/2020   LDLCALC 193 (H) 02/22/2020   TRIG 322 (H) 02/22/2020   CHOLHDL 5.4 (H) 02/22/2020   Lab Results  Component Value Date   WBC 8.5 02/22/2020   HGB 13.5 02/22/2020   HCT 41.8 02/22/2020   MCV 92 02/22/2020   PLT 335 02/22/2020    Attestation Statements:   Reviewed by clinician on day of visit: allergies, medications, problem list, medical history, surgical history, family history, social history, and previous encounter notes.  02/24/2020, am acting as Edmund Hilda for Energy manager, DO.  I have reviewed the above documentation for accuracy and completeness, and I agree with the above. [employer], D.O.  The 21st Century Cures Act was signed into law in 2016 which includes the topic of electronic health records.  This provides immediate access to information in MyChart.  This includes consultation notes, operative notes, office notes, lab results and pathology reports.  If you have any questions about what you read please let 2017 know at your next visit so we can discuss your concerns and take corrective action if need be.  We are right here with  you.

## 2020-04-24 ENCOUNTER — Encounter (INDEPENDENT_AMBULATORY_CARE_PROVIDER_SITE_OTHER): Payer: Self-pay | Admitting: Family Medicine

## 2020-05-15 ENCOUNTER — Encounter (INDEPENDENT_AMBULATORY_CARE_PROVIDER_SITE_OTHER): Payer: Self-pay

## 2020-05-20 ENCOUNTER — Telehealth (INDEPENDENT_AMBULATORY_CARE_PROVIDER_SITE_OTHER): Payer: Medicare Other | Admitting: Family Medicine

## 2020-05-21 ENCOUNTER — Other Ambulatory Visit: Payer: Self-pay | Admitting: Family Medicine

## 2020-05-21 DIAGNOSIS — F341 Dysthymic disorder: Secondary | ICD-10-CM

## 2020-05-21 NOTE — Telephone Encounter (Signed)
Please call pt and get her scheduled for a f/u for the Sertraline. Her last appt was 04/01/2020 and she was to f/u in 3 months.

## 2020-05-21 NOTE — Telephone Encounter (Signed)
Patient has an appointment scheduled for 07/01/2020. AM

## 2020-05-29 ENCOUNTER — Encounter: Payer: Self-pay | Admitting: Family Medicine

## 2020-05-29 ENCOUNTER — Ambulatory Visit (INDEPENDENT_AMBULATORY_CARE_PROVIDER_SITE_OTHER): Payer: Medicare Other

## 2020-05-29 ENCOUNTER — Other Ambulatory Visit: Payer: Self-pay

## 2020-05-29 DIAGNOSIS — Z90710 Acquired absence of both cervix and uterus: Secondary | ICD-10-CM | POA: Diagnosis not present

## 2020-05-29 DIAGNOSIS — Z13820 Encounter for screening for osteoporosis: Secondary | ICD-10-CM

## 2020-05-29 DIAGNOSIS — Z780 Asymptomatic menopausal state: Secondary | ICD-10-CM | POA: Diagnosis not present

## 2020-05-29 DIAGNOSIS — Z1231 Encounter for screening mammogram for malignant neoplasm of breast: Secondary | ICD-10-CM

## 2020-05-29 DIAGNOSIS — E2839 Other primary ovarian failure: Secondary | ICD-10-CM | POA: Diagnosis not present

## 2020-05-29 DIAGNOSIS — Z90721 Acquired absence of ovaries, unilateral: Secondary | ICD-10-CM | POA: Diagnosis not present

## 2020-05-29 DIAGNOSIS — M8580 Other specified disorders of bone density and structure, unspecified site: Secondary | ICD-10-CM | POA: Insufficient documentation

## 2020-05-29 DIAGNOSIS — M85851 Other specified disorders of bone density and structure, right thigh: Secondary | ICD-10-CM | POA: Diagnosis not present

## 2020-05-30 ENCOUNTER — Encounter (INDEPENDENT_AMBULATORY_CARE_PROVIDER_SITE_OTHER): Payer: Self-pay | Admitting: Family Medicine

## 2020-05-30 ENCOUNTER — Ambulatory Visit (INDEPENDENT_AMBULATORY_CARE_PROVIDER_SITE_OTHER): Payer: Medicare Other | Admitting: Family Medicine

## 2020-05-30 VITALS — BP 114/76 | HR 73 | Temp 98.2°F | Ht 65.0 in | Wt 187.0 lb

## 2020-05-30 DIAGNOSIS — E7849 Other hyperlipidemia: Secondary | ICD-10-CM | POA: Diagnosis not present

## 2020-05-30 DIAGNOSIS — E559 Vitamin D deficiency, unspecified: Secondary | ICD-10-CM | POA: Diagnosis not present

## 2020-05-30 DIAGNOSIS — E669 Obesity, unspecified: Secondary | ICD-10-CM

## 2020-05-30 DIAGNOSIS — Z9189 Other specified personal risk factors, not elsewhere classified: Secondary | ICD-10-CM

## 2020-05-30 DIAGNOSIS — Z6831 Body mass index (BMI) 31.0-31.9, adult: Secondary | ICD-10-CM

## 2020-05-30 MED ORDER — ROSUVASTATIN CALCIUM 10 MG PO TABS: 10.0000 mg | ORAL_TABLET | Freq: Every day | ORAL | 0 refills | Status: DC

## 2020-05-30 MED ORDER — VITAMIN D (ERGOCALCIFEROL) 1.25 MG (50000 UNIT) PO CAPS: 50000.0000 [IU] | ORAL_CAPSULE | ORAL | 0 refills | Status: DC

## 2020-06-03 NOTE — Progress Notes (Signed)
Chief Complaint:   OBESITY Carolyn [name] is here to discuss her progress with her obesity treatment plan along with follow-up of her obesity related diagnoses. Carolyn [name] is on the Category 1 Plan and states she is following her eating plan approximately 75% of the time. Carolyn [name] states she is painting and moving 4-6 hours 6 times per week.  Today's visit was #: 6 Starting weight: 207 lbs Starting date: 02/22/2020 Today's weight: 187 lbs Today's date: 05/30/2020 Total lbs lost to date: 20 lbs Total lbs lost since last in-office visit: 6 lbs Total weight loss percentage to date: -9.66%  Interim History: Carolyn [name]'s last OV was 04/22/2020. She has done more activity the past couple of weeks. She denies problem with meal plan. Pt wants to come in every 4 weeks.  Assessment/Plan:   1. Other hyperlipidemia Carolyn [name] has hyperlipidemia and has been trying to improve her cholesterol levels with intensive lifestyle modification including a low saturated fat diet, exercise and weight loss. She denies any chest pain, claudication or myalgias.  Lab Results  Component Value Date   ALT 16 02/22/2020   AST 16 02/22/2020   ALKPHOS 80 02/22/2020   BILITOT 0.3 02/22/2020   Lab Results  Component Value Date   CHOL 316 (H) 02/22/2020   HDL 59 02/22/2020   LDLCALC 193 (H) 02/22/2020   TRIG 322 (H) 02/22/2020   CHOLHDL 5.4 (H) 02/22/2020   Plan: Refill Crestor for 1 month, as per below. Cardiovascular risk and specific lipid/LDL goals reviewed.  We discussed several lifestyle modifications today and Carolyn [name] will continue to work on diet, exercise and weight loss efforts. Orders and follow up as documented in patient record.   Counseling Intensive lifestyle modifications are the first line treatment for this issue. . Dietary changes: Increase soluble fiber. Decrease simple carbohydrates. . Exercise changes: Moderate to vigorous-intensity aerobic activity 150 minutes per week if tolerated. . Lipid-lowering  medications: see documented in medical record.  - rosuvastatin (CRESTOR) 10 MG tablet; Take 1 tablet (10 mg total) by mouth at bedtime.  Dispense: 30 tablet; Refill: 0  2. Vitamin D deficiency Carolyn [name] had a bone density scan yesterday. Carolyn [name]'s Vitamin D level was 23.9 on 02/22/2020. She is currently taking prescription vitamin D 50,000 IU each week. She denies nausea, vomiting or muscle weakness.   Ref. Range 02/22/2020 10:50  Vitamin D, 25-Hydroxy Latest Ref Range: 30.0 - 100.0 ng/mL 23.9 (L)   Plan: Refill Vit D for 1 month, as per below. Low Vitamin D level contributes to fatigue and are associated with obesity, breast, and colon cancer. She agrees to continue to take prescription Vitamin D @50 ,000 IU every week and will follow-up for routine testing of Vitamin D, at least 2-3 times per year to avoid over-replacement.  Refill- Vitamin D, Ergocalciferol, (DRISDOL) 1.25 MG (50000 UNIT) CAPS capsule; Take 1 capsule (50,000 Units total) by mouth every 7 (seven) days.  Dispense: 4 capsule; Refill: 0  3. At risk for osteoporosis Carolyn [name] was given approximately 9 minutes of osteoporosis prevention counseling today. Carolyn [name] is at risk for osteopenia and osteoporosis due to her Vitamin D deficiency. She was encouraged to take her Vitamin D and follow her higher calcium diet and increase strengthening exercise to help strengthen her bones and decrease her risk of osteopenia and osteoporosis.  4. Class 1 obesity with serious comorbidity and body mass index (BMI) of 31.0 to 31.9 in adult, unspecified obesity type Carolyn [name] is currently in the action stage of change. As such, her goal  is to continue with weight loss efforts. She has agreed to change to keeping a food journal and adhering to recommended goals of 1300-1400 calories and 90+ g protein.   Exercise goals: All adults should avoid inactivity. Some physical activity is better than none, and adults who participate in any amount of physical activity gain some  health benefits.  Behavioral modification strategies: increasing water intake, meal planning and cooking strategies, keeping healthy foods in the home and planning for success.  Carolyn [name] has agreed to follow-up with our clinic in 2 weeks. She was informed of the importance of frequent follow-up visits to maximize her success with intensive lifestyle modifications for her multiple health conditions.   Objective:   Blood pressure 114/76, pulse 73, temperature 98.2 F (36.8 C), height 5\' 5"  (1.651 m), weight 187 lb (84.8 kg), SpO2 97 %. Body mass index is 31.12 kg/m.  General: Cooperative, alert, well developed, in no acute distress. HEENT: Conjunctivae and lids unremarkable. Cardiovascular: Regular rhythm.  Lungs: Normal work of breathing. Neurologic: No focal deficits.   Lab Results  Component Value Date   CREATININE 0.93 02/22/2020   BUN 11 02/22/2020   NA 137 02/22/2020   K 4.3 02/22/2020   CL 101 02/22/2020   CO2 21 02/22/2020   Lab Results  Component Value Date   ALT 16 02/22/2020   AST 16 02/22/2020   ALKPHOS 80 02/22/2020   BILITOT 0.3 02/22/2020   Lab Results  Component Value Date   HGBA1C 6.1 (H) 02/22/2020   Lab Results  Component Value Date   INSULIN 12.2 02/22/2020   Lab Results  Component Value Date   TSH 0.974 02/22/2020   Lab Results  Component Value Date   CHOL 316 (H) 02/22/2020   HDL 59 02/22/2020   LDLCALC 193 (H) 02/22/2020   TRIG 322 (H) 02/22/2020   CHOLHDL 5.4 (H) 02/22/2020   Lab Results  Component Value Date   WBC 8.5 02/22/2020   HGB 13.5 02/22/2020   HCT 41.8 02/22/2020   MCV 92 02/22/2020   PLT 335 02/22/2020   No results found for: IRON, TIBC, FERRITIN  Attestation Statements:   Reviewed by clinician on day of visit: allergies, medications, problem list, medical history, surgical history, family history, social history, and previous encounter notes.  02/24/2020, am acting as Carolyn [name] for Energy manager,  DO.  I have reviewed the above documentation for accuracy and completeness, and I agree with the above. [employer], D.O.  The 21st Century Cures Act was signed into law in 2016 which includes the topic of electronic health records.  This provides immediate access to information in MyChart.  This includes consultation notes, operative notes, office notes, lab results and pathology reports.  If you have any questions about what you read please let 2017 know at your next visit so we can discuss your concerns and take corrective action if need be.  We are right here with you.

## 2020-06-18 DIAGNOSIS — H40013 Open angle with borderline findings, low risk, bilateral: Secondary | ICD-10-CM | POA: Diagnosis not present

## 2020-06-27 ENCOUNTER — Ambulatory Visit (INDEPENDENT_AMBULATORY_CARE_PROVIDER_SITE_OTHER): Payer: Medicare Other | Admitting: Family Medicine

## 2020-06-27 ENCOUNTER — Other Ambulatory Visit: Payer: Self-pay

## 2020-06-27 ENCOUNTER — Encounter (INDEPENDENT_AMBULATORY_CARE_PROVIDER_SITE_OTHER): Payer: Self-pay | Admitting: Family Medicine

## 2020-06-27 VITALS — BP 125/75 | HR 85 | Temp 98.2°F | Ht 65.0 in | Wt 186.0 lb

## 2020-06-27 DIAGNOSIS — Z6831 Body mass index (BMI) 31.0-31.9, adult: Secondary | ICD-10-CM

## 2020-06-27 DIAGNOSIS — R7303 Prediabetes: Secondary | ICD-10-CM | POA: Diagnosis not present

## 2020-06-27 DIAGNOSIS — E559 Vitamin D deficiency, unspecified: Secondary | ICD-10-CM | POA: Diagnosis not present

## 2020-06-27 DIAGNOSIS — E669 Obesity, unspecified: Secondary | ICD-10-CM

## 2020-06-27 DIAGNOSIS — E66811 Obesity, class 1: Secondary | ICD-10-CM

## 2020-06-27 DIAGNOSIS — E7849 Other hyperlipidemia: Secondary | ICD-10-CM | POA: Diagnosis not present

## 2020-06-27 MED ORDER — VITAMIN D (ERGOCALCIFEROL) 1.25 MG (50000 UNIT) PO CAPS
50000.0000 [IU] | ORAL_CAPSULE | ORAL | 0 refills | Status: DC
Start: 2020-06-27 — End: 2020-07-29

## 2020-06-27 MED ORDER — ROSUVASTATIN CALCIUM 10 MG PO TABS: 10.0000 mg | ORAL_TABLET | Freq: Every day | ORAL | 0 refills | Status: DC

## 2020-07-01 ENCOUNTER — Ambulatory Visit (INDEPENDENT_AMBULATORY_CARE_PROVIDER_SITE_OTHER): Payer: Medicare Other | Admitting: Family Medicine

## 2020-07-01 ENCOUNTER — Encounter: Payer: Self-pay | Admitting: Family Medicine

## 2020-07-01 ENCOUNTER — Other Ambulatory Visit: Payer: Self-pay

## 2020-07-01 VITALS — BP 121/63 | HR 75 | Ht 65.0 in | Wt 189.0 lb

## 2020-07-01 DIAGNOSIS — L821 Other seborrheic keratosis: Secondary | ICD-10-CM

## 2020-07-01 DIAGNOSIS — G5751 Tarsal tunnel syndrome, right lower limb: Secondary | ICD-10-CM | POA: Diagnosis not present

## 2020-07-01 DIAGNOSIS — M7661 Achilles tendinitis, right leg: Secondary | ICD-10-CM

## 2020-07-01 NOTE — Progress Notes (Signed)
Established Patient Office Visit  Subjective:  Patient ID: Carolyn [name], female    DOB: 08-21-1955  Age: 65 y.o. MRN: [id number]  CC:  Chief Complaint  Patient presents with  . Foot Pain    R foot pain   . Eczema    Pt reports a dry skin patch on her back       HPI Carolyn [name] presents for   Right foot pain on and off for several weeks.  She says that ever since she retired she is actually been up on her feet a lot more than when she was working.  She is having more pain in her right foot particularly over the posterior heel and Achilles area as well as medially in the posterior arch going up towards her medial malleolus.  She notices that it is worse with standing and activity.  She has more recently been trying to avoid going barefoot and trying to wear good supportive shoe.  No old injuries or traumas but she does have chronic foot pain which she says she has had since she was young.  She also has been seeing Dr. Sharee Holster for weight loss.  More recently started using my fitness pal and plans on starting to use a fit bit which she recently acquired.  She does have a spot on her right upper back that she would like me to look at she says it feels like a little dry patch of skin.  Past Medical History:  Diagnosis Date  . Anemia   . Anxiety   . Chest pain   . Chronic pain of both feet   . Depression   . Fibromyalgia   . Flat feet   . High cholesterol   . Joint pain   . Knee pain, bilateral   . Obesity   . Osteoarthritis    neck  . SOB (shortness of breath) on exertion   . Vitamin D deficiency     Past Surgical History:  Procedure Laterality Date  . TONSILLECTOMY  15  . TOTAL ABDOMINAL HYSTERECTOMY  10/90   has her left ovary    Family History  Problem Relation Age of Onset  . Cancer Other        breast  . Diabetes Father   . Heart disease Father   . Obesity Father   . Heart attack Brother   . Breast cancer Mother 60  . Cancer Mother   . Lung cancer  Brother     Social History   Socioeconomic History  . Marital status: Widowed    Spouse name: Not on file  . Number of children: Not on file  . Years of education: Not on file  . Highest education level: Not on file  Occupational History  . Occupation: Retired  Tobacco Use  . Smoking status: Never Smoker  . Smokeless tobacco: Never Used  Vaping Use  . Vaping Use: Never used  Substance and Sexual Activity  . Alcohol use: No  . Drug use: No  . Sexual activity: Not on file  Other Topics Concern  . Not on file  Social History Narrative  . Not on file   Social Determinants of Health   Financial Resource Strain: Not on file  Food Insecurity: Not on file  Transportation Needs: Not on file  Physical Activity: Not on file  Stress: Not on file  Social Connections: Not on file  Intimate Partner Violence: Not on file    Outpatient Medications Prior to Visit  Medication Sig Dispense Refill  . Ascorbic Acid (VITAMIN C PO) Take 1,000 mg by mouth daily.    . Cholecalciferol (VITAMIN D-3 PO) Take 5,000 Units by mouth daily.    . cyclobenzaprine (FLEXERIL) 5 MG tablet TAKE 1 OR 2 TABLETS BY MOUTH DAILY AT BEDTIME AS NEEDED FOR MUSCLE SPASMS 60 tablet 1  . cycloSPORINE (RESTASIS) 0.05 % ophthalmic emulsion 1 drop 2 (two) times daily.    . Multiple Vitamins-Minerals (ZINC PO) Take by mouth. 22 mg/800 mg daily    . rosuvastatin (CRESTOR) 10 MG tablet Take 1 tablet (10 mg total) by mouth at bedtime. 30 tablet 0  . sertraline (ZOLOFT) 100 MG tablet Take 1 tablet (100 mg total) by mouth daily. 90 tablet 0  . sertraline (ZOLOFT) 25 MG tablet Take 1 tablet (25 mg total) by mouth daily. 30 tablet 0  . Vitamin D, Ergocalciferol, (DRISDOL) 1.25 MG (50000 UNIT) CAPS capsule Take 1 capsule (50,000 Units total) by mouth every 7 (seven) days. 4 capsule 0  . AMBULATORY NON FORMULARY MEDICATION Medication Name: Shingrix IM x 1. Repeat in 2-6 months. 1 Units 0  . AMBULATORY NON FORMULARY MEDICATION  Medication Name: Tdap IM x 1 1 Units 0   No facility-administered medications prior to visit.    No Known Allergies  ROS Review of Systems    Objective:    Physical Exam Vitals reviewed.  Constitutional:      Appearance: She is well-developed and well-nourished.  HENT:     Head: Normocephalic and atraumatic.  Eyes:     Extraocular Movements: EOM normal.     Conjunctiva/sclera: Conjunctivae normal.  Cardiovascular:     Rate and Rhythm: Normal rate.  Pulmonary:     Effort: Pulmonary effort is normal.  Musculoskeletal:     Comments: Right foot nontender over the Achilles tendon or heel.  Normal range of motion.  Negative anterior drawer test.  Posterior tibial and dorsal pedal pulse 2+.  Good capillary refill at the great toe.  She does have a little bit of tenderness over the proximal head of the first metatarsal  Skin:    General: Skin is dry.     Coloration: Skin is not pale.     Comments: She does have what looks like a seborrheic keratosis on her right upper back measuring approximately a centimeter in size.  No abnormal looking features.  Neurological:     Mental Status: She is alert and oriented to person, place, and time.  Psychiatric:        Mood and Affect: Mood and affect normal.        Behavior: Behavior normal.     BP 121/63   Pulse 75   Ht 5\' 5"  (1.651 m)   Wt 189 lb (85.7 kg)   SpO2 99%   BMI 31.45 kg/m  Wt Readings from Last 3 Encounters:  07/01/20 189 lb (85.7 kg)  06/27/20 186 lb (84.4 kg)  05/30/20 187 lb (84.8 kg)     Health Maintenance Due  Topic Date Due  . Fecal DNA (Cologuard)  09/25/2018  . TETANUS/TDAP  08/29/2019  . COVID-19 Vaccine (2 - Booster for 08/31/2019 series) 09/22/2019  . PNA vac Low Risk Adult (1 of 2 - PCV13) Never done    There are no preventive care reminders to display for this patient.  Lab Results  Component Value Date   TSH 0.974 02/22/2020   Lab Results  Component Value Date   WBC 8.5 02/22/2020   HGB  13.5  02/22/2020   HCT 41.8 02/22/2020   MCV 92 02/22/2020   PLT 335 02/22/2020   Lab Results  Component Value Date   NA 137 02/22/2020   K 4.3 02/22/2020   CO2 21 02/22/2020   GLUCOSE 101 (H) 02/22/2020   BUN 11 02/22/2020   CREATININE 0.93 02/22/2020   BILITOT 0.3 02/22/2020   ALKPHOS 80 02/22/2020   AST 16 02/22/2020   ALT 16 02/22/2020   PROT 7.3 02/22/2020   ALBUMIN 4.7 02/22/2020   CALCIUM 9.8 02/22/2020   Lab Results  Component Value Date   CHOL 316 (H) 02/22/2020   Lab Results  Component Value Date   HDL 59 02/22/2020   Lab Results  Component Value Date   LDLCALC 193 (H) 02/22/2020   Lab Results  Component Value Date   TRIG 322 (H) 02/22/2020   Lab Results  Component Value Date   CHOLHDL 5.4 (H) 02/22/2020   Lab Results  Component Value Date   HGBA1C 6.1 (H) 02/22/2020      Assessment & Plan:   Problem List Items Addressed This Visit   None   Visit Diagnoses    Achilles tendinitis of right lower extremity    -  Primary   Tarsal tunnel syndrome of right side       Seborrheic keratosis         Achilles tendinitis-normal strength on exam today nontender.  But did give her stretches to do on her own at home when it bothers her.  Right foot pain most consistent with tarsal tunnel syndrome-given some additional formation as well as some exercises to do on her own at home.  Did encourage her to continue to work on wearing good supportive shoe wear and if persisting we could consider referring her to either sports medicine or podiatry for further intervention.  Seborrheic keratoses-discussed the lesion on her back is most consistent with a Seb K and is a benign inherited lesion.  Treatment can include cryotherapy but at this point she would prefer to just monitor.  reminded her to schedule her tetanus vaccine.  No orders of the defined types were placed in this encounter.   Follow-up: Return in about 6 months (around 12/29/2020).    Nani Gasser,  MD

## 2020-07-01 NOTE — Patient Instructions (Addendum)
You are doing a great job!!!  Keep up the good work!   Tarsal Tunnel Syndrome  Tarsal tunnel syndrome is a condition that happens when a nerve called the tibial nerve is irritated or squeezed (compressed) as it passes through an area on the inside of your ankle (tarsal tunnel). The tarsal tunnel is a narrow passage through the connective tissue and bones in your feet (tarsals). The tibial nerve passes behind the large bony bump at your inner ankle (medial malleolus) and sends branches to your foot and toes. This nerve enables feeling by passing signals to your heel, the bottom of your foot, and some of your toe muscles. Tarsal tunnel syndrome usually causes ankle and foot pain that gets worse with activity. What are the causes? Tarsal tunnel syndrome can be caused by any condition that narrows the space in the tarsal tunnel. Athletes may get tarsal tunnel syndrome from a fractured ankle or from an outward (eversion) ankle sprain that results in scarring or swelling. Other common causes include:  Overpronation. This is when your feet roll inward and flatten too much when you stand, walk, or run.  Extra pressure on the tarsal tunnel area from tight or stiff shoes or boots.  Decreased room in the tarsal tunnel due to small, fluid-filled sacs (cysts) or growths on the bones near the tunnel (exostosis). What increases the risk? This condition is more likely to develop in people who:  Play sports where they wear high, stiff boots, such as downhill skiing.  Play sports with repetitive motion, such as running.  Play sports on uneven surfaces that can lead to a sprained ankle, such as soccer or football.  Have had an inner ankle injury.  Have flat feet.  Have other conditions, such as diabetes, hypothyroidism, or rheumatoid arthritis. What are the signs or symptoms? Symptoms of this condition include:  Burning pain behind the ankle, in the heel, or in the foot. This pain gets worse if you are  standing, walking, or running.  Numbness or a prickling and tingling sensation in your heel, foot, or toes.  Swelling in your ankle or heel. At first, your symptoms may get worse with activity and be relieved with rest. Over time, your symptoms may become constant or come on sooner with less activity. How is this diagnosed? This condition is diagnosed based on:  Your symptoms.  Your medical history.  A physical exam. Your health care provider may tap on the area below your ankle to check for tingling in your foot or toes.  You may also have other tests, including: ? X-rays to check bone structure. ? An MRI or ultrasound to examine nerve and tendon structures and find where your nerve is getting compressed. ? A study of nerve function (electromyography or EMG). How is this treated? Treatment may include:  Wearing a removable splint or boot for ankle support.  Using a shoe insert (orthotic) to help support your arch.  Taking NSAIDs to reduce pain.  Using ice to reduce swelling.  Having medicine injected into your ankle joint to reduce pain and swelling.  Starting range-of-motion exercises and strengthening exercises.  Gradually returning to full activity. The timing will depend on the severity of your condition and your response to treatment.  Surgery. Follow these instructions at home: If you have a splint or boot:  Wear the splint or boot as told by your health care provider. Remove it only as told by your health care provider.  Loosen the splint or boot  if your toes tingle, become numb, or turn cold and blue.  Keep the splint or boot clean.  If your splint or boot is not waterproof: ? Do not let it get wet. ? Cover it with a watertight covering when you take a bath or shower.  Ask your health care provider when it is safe to drive if your splint or boot is on a foot that you use for driving. Managing pain, stiffness, and swelling  If directed, put ice on the  injured area. ? If you have a removable splint or boot, remove it as told by your health care provider. ? Put ice in a plastic bag. ? Place a towel between your skin and the bag. ? Leave the ice on for 20 minutes, 2-3 times a day.  Move your toes often to reduce stiffness and swelling.  Raise (elevate) your foot above the level of your heart while you are sitting or lying down.   Activity  Return to your normal activities as told by your health care provider. Ask your health care provider what activities are safe for you.  Do exercises as told by your health care provider. General instructions  Take over-the-counter and prescription medicines only as told by your health care provider.  Do not use any products that contain nicotine or tobacco, such as cigarettes, e-cigarettes, and chewing tobacco. These can delay healing. If you need help quitting, ask your health care provider.  Keep all follow-up visits as told by your health care provider. This is important. How is this prevented?  Give your body time to rest between periods of activity.  Make sure to wear supportive and comfortable shoes during athletic activity.  Do not overtighten ski boots or the laces on high-top shoes.  Be safe and responsible while being active to avoid falls. Contact a health care provider if:  Your ankle pain is not getting better.  You are unable to support (bear) body weight on your foot without pain. Summary  Tarsal tunnel syndrome is a condition that happens when a nerve called the tibial nerve is irritated or squeezed (compressed) as it passes through an area on the inside of your ankle (tarsal tunnel).  Tarsal tunnel syndrome usually causes ankle and foot pain that gets worse with activity.  This condition may be treated with a splint or boot, shoe inserts, ice, exercises, medicines, and surgery if needed.  Follow your health care provider's instructions for caring for your splint or  boot.  Contact your health care provider if your ankle pain is not getting better, or if you are unable to bear your body weight without pain. This information is not intended to replace advice given to you by your health care provider. Make sure you discuss any questions you have with your health care provider. Document Revised: 03/10/2018 Document Reviewed: 03/10/2018 Elsevier Patient Education  2021 [employer]  Tarsal Tunnel Syndrome Rehab Ask your health care provider which exercises are safe for you. Do exercises exactly as told by your health care provider and adjust them as directed. It is normal to feel mild stretching, pulling, tightness, or discomfort as you do these exercises. Stop right away if you feel sudden pain or your pain gets worse. Do not begin these exercises until told by your health care provider. Stretching and range-of-motion exercises These exercises warm up your muscles and joints and improve the movement and flexibility of your foot. These exercises also help to relieve pain, numbness, and tingling. Gastrocnemius  stretch, standing This exercise is also called a calf stretch. It stretches the muscles in the back of the lower leg (gastrocnemius). 1. Stand with your hands against a wall. 2. Extend your left / right leg behind you, and bend your front knee slightly. Your heels should be on the floor. 3. Keeping your heels on the floor and your back knee straight, shift your weight toward the wall. Do not arch your back. You should feel a gentle stretch in the back of your lower leg (calf). 4. Hold this position for __________ seconds. 5. Return to the starting position. Repeat __________ times. Complete this exercise __________ times a day.   Tibial nerve glide 1. Sit on a stable chair with both feet on the floor. 2. Clasp your hands together behind your back. Gently round your back and tuck your chin toward your chest. 3. Slowly straighten your knee as far as you  can without increasing your symptoms. 4. Turn your left / right foot so that your toes are pointing outward. 5. Slowly tip your toes toward your shin. 6. Hold this position for __________ seconds. 7. Slowly return to the starting position. Repeat __________ times. Complete this exercise __________ times a day. Strengthening exercises These exercises build strength and endurance in your foot. Endurance is the ability to use your muscles for a long time, even after they get tired. Plantar flexion with band 1. Sit on the floor with your left / right leg extended. 2. Loop a rubber exercise band or tube around the ball of your left / right foot. The ball of your foot is on the walking surface, right under your toes. The band or tube should be slightly tense when your foot is relaxed. 3. Hold the two ends of the band or tube in your hands. 4. Slowly point your toes downward, pushing them away from you (plantar flexion). Stop pushing your toes down if you have any pain. 5. Hold this position for __________ seconds. 6. Let the band or tube slowly pull your foot back to the starting position. Repeat __________ times. Complete this exercise __________ times a day.   Ankle inversion with band 1. Secure one end of an exercise band or tubing to a fixed object, such as a table leg or a pole, that will stay still when the band is pulled. 2. Secure the other end of the band around your left / right foot, near your toes. 3. Sit on the floor, facing the fixed object. The band should be slightly tense when your foot is relaxed. 4. Make fists with your hands and put them between your knees. This will focus your strengthening at your ankle. 5. Leading with your big toe, slowly pull your banded foot inward, toward your body (inversion). The band or tube should be adding resistance. 6. Hold this position for __________ seconds. 7. Let the band or tube slowly pull your foot back to the starting position. Repeat  __________ times. Complete this exercise __________ times a day. Arch lifts This exercise strengthens the main muscles of your foot (foot intrinsics). 1. Sit in a chair with your feet flat on the floor. 2. Keeping your big toe and your heel on the floor, lift only your arch, which is on the inner edge of your left / right foot. Do not move your knee or scrunch your toes. This is a small movement. 3. Hold this position for __________ seconds. 4. Slowly return to the starting position. Repeat __________ times. Complete  this exercise __________ times a day. This information is not intended to replace advice given to you by your health care provider. Make sure you discuss any questions you have with your health care provider. Document Revised: 08/09/2018 Document Reviewed: 08/09/2018 Elsevier Patient Education  2021 [employer].

## 2020-07-01 NOTE — Progress Notes (Signed)
Chief Complaint:   OBESITY Daphna is here to discuss her progress with her obesity treatment plan along with follow-up of her obesity related diagnoses.   Today's visit was #: 7 Starting weight: 207 lbs Starting date: 02/22/2020 Today's weight: 186 lbs Today's date: 06/27/2020 Total lbs lost to date: 20 lbs Body mass index is 30.95 kg/m.  Total weight loss percentage to date: -9.66%  Interim History:  Jaymes had her granddaughter's baby shower and Valentine's day celebrations.  She did more off plan eating than usual.  Current Meal Plan: keeping a food journal and adhering to recommended goals of 1300-1400 calories and 90 grams of protein for 70% of the time.  Current Exercise Plan: Active at home putting her house back together. Current Anti-Obesity Medications: None.   Assessment/Plan:   Orders Placed This Encounter  Procedures  . Comprehensive metabolic panel  . Hemoglobin A1c  . Insulin, random  . Lipid panel  . VITAMIN D 25 Hydroxy (Vit-D Deficiency, Fractures)    Medications Discontinued During This Encounter  Medication Reason  . Vitamin D, Ergocalciferol, (DRISDOL) 1.25 MG (50000 UNIT) CAPS capsule Reorder  . rosuvastatin (CRESTOR) 10 MG tablet Reorder     Meds ordered this encounter  Medications  . rosuvastatin (CRESTOR) 10 MG tablet    Sig: Take 1 tablet (10 mg total) by mouth at bedtime.    Dispense:  30 tablet    Refill:  0  . Vitamin D, Ergocalciferol, (DRISDOL) 1.25 MG (50000 UNIT) CAPS capsule    Sig: Take 1 capsule (50,000 Units total) by mouth every 7 (seven) days.    Dispense:  4 capsule    Refill:  0     1. Other hyperlipidemia Course: Not at goal. Lipid-lowering medications: Crestor 10 mg daily.  No side effects on medication.  Tolerating well.  Plan: Dietary changes: Increase soluble fiber, decrease simple carbohydrates, decrease saturated fat. Exercise changes: Moderate to vigorous-intensity aerobic activity 150 minutes per week or as  tolerated. We will continue to monitor along with PCP/specialists as it pertains to her weight loss journey.  Will refill Crestor today and check CMP and FLP at next office visit, as per below.  Lab Results  Component Value Date   CHOL 316 (H) 02/22/2020   HDL 59 02/22/2020   LDLCALC 193 (H) 02/22/2020   TRIG 322 (H) 02/22/2020   CHOLHDL 5.4 (H) 02/22/2020   Lab Results  Component Value Date   ALT 16 02/22/2020   AST 16 02/22/2020   ALKPHOS 80 02/22/2020   BILITOT 0.3 02/22/2020   The 10-year ASCVD risk score Denman George DC Jr., et al., 2013) is: 6.6%   Values used to calculate the score:     Age: 84 years     Sex: Female     Is Non-Hispanic African American: No     Diabetic: No     Tobacco smoker: No     Systolic Blood Pressure: 125 mmHg     Is BP treated: No     HDL Cholesterol: 59 mg/dL     Total Cholesterol: 316 mg/dL  - Refill rosuvastatin (CRESTOR) 10 MG tablet; Take 1 tablet (10 mg total) by mouth at bedtime.  Dispense: 30 tablet; Refill: 0 - Comprehensive metabolic panel - Lipid panel  2. Vitamin D deficiency Not at goal. Current vitamin D is 23.9, tested on 02/22/2020. Optimal goal > 50 ng/dL.  She is taking vitamin D 50,000 IU weekly.    Plan: Continue to take prescription  Vitamin D @50 ,000 IU every week as prescribed.  Will check vitamin D level at next office visit.  - Refill Vitamin D, Ergocalciferol, (DRISDOL) 1.25 MG (50000 UNIT) CAPS capsule; Take 1 capsule (50,000 Units total) by mouth every 7 (seven) days.  Dispense: 4 capsule; Refill: 0 - VITAMIN D 25 Hydroxy (Vit-D Deficiency, Fractures)  3. Prediabetes Not at goal. Goal is HgbA1c < 5.7.  Medication: None.    Plan:  She will continue to focus on protein-rich, low simple carbohydrate foods. We reviewed the importance of hydration, regular exercise for stress reduction, and restorative sleep.  Will check CMP, A1c, and fasting insulin level at next office visit.  Lab Results  Component Value Date   HGBA1C  6.1 (H) 02/22/2020   Lab Results  Component Value Date   INSULIN 12.2 02/22/2020   - Comprehensive metabolic panel - Hemoglobin A1c - Insulin, random  4. Class 1 obesity with serious comorbidity and body mass index (BMI) of 31.0 to 31.9 in adult, unspecified obesity type  Course: Heydy is currently in the action stage of change. As such, her goal is to continue with weight loss efforts.   Nutrition goals: She has agreed to keeping a food journal and adhering to recommended goals of 1300-1400 calories and 90 grams of protein.   Exercise goals: All adults should avoid inactivity. Some physical activity is better than none, and adults who participate in any amount of physical activity gain some health benefits.  Behavioral modification strategies: celebration eating strategies and planning for success.  Jordy has agreed to follow-up with our clinic in 2 weeks with APP.  She will come fasting for blood work.  She was informed of the importance of frequent follow-up visits to maximize her success with intensive lifestyle modifications for her multiple health conditions.   Objective:   Blood pressure 125/75, pulse 85, temperature 98.2 F (36.8 C), height 5\' 5"  (1.651 m), weight 186 lb (84.4 kg), SpO2 93 %. Body mass index is 30.95 kg/m.  General: Cooperative, alert, well developed, in no acute distress. HEENT: Conjunctivae and lids unremarkable. Cardiovascular: Regular rhythm.  Lungs: Normal work of breathing. Neurologic: No focal deficits.   Lab Results  Component Value Date   CREATININE 0.93 02/22/2020   BUN 11 02/22/2020   NA 137 02/22/2020   K 4.3 02/22/2020   CL 101 02/22/2020   CO2 21 02/22/2020   Lab Results  Component Value Date   ALT 16 02/22/2020   AST 16 02/22/2020   ALKPHOS 80 02/22/2020   BILITOT 0.3 02/22/2020   Lab Results  Component Value Date   HGBA1C 6.1 (H) 02/22/2020   Lab Results  Component Value Date   INSULIN 12.2 02/22/2020   Lab Results   Component Value Date   TSH 0.974 02/22/2020   Lab Results  Component Value Date   CHOL 316 (H) 02/22/2020   HDL 59 02/22/2020   LDLCALC 193 (H) 02/22/2020   TRIG 322 (H) 02/22/2020   CHOLHDL 5.4 (H) 02/22/2020   Lab Results  Component Value Date   WBC 8.5 02/22/2020   HGB 13.5 02/22/2020   HCT 41.8 02/22/2020   MCV 92 02/22/2020   PLT 335 02/22/2020   Obesity Behavioral Intervention:   Approximately 15 minutes were spent on the discussion below.  ASK: We discussed the diagnosis of obesity with Collie today and Agam agreed to give 02/24/2020 permission to discuss obesity behavioral modification therapy today.  ASSESS: Alysen has the diagnosis of obesity and her BMI  today is 31.1. Neesha is in the action stage of change.   ADVISE: Kellis was educated on the multiple health risks of obesity as well as the benefit of weight loss to improve her health. She was advised of the need for long term treatment and the importance of lifestyle modifications to improve her current health and to decrease her risk of future health problems.  AGREE: Multiple dietary modification options and treatment options were discussed and Jeryn agreed to follow the recommendations documented in the above note.  ARRANGE: Makenli was educated on the importance of frequent visits to treat obesity as outlined per CMS and USPSTF guidelines and agreed to schedule her next follow up appointment today.  Attestation Statements:   Reviewed by clinician on day of visit: allergies, medications, problem list, medical history, surgical history, family history, social history, and previous encounter notes.  I, Insurance claims handler, CMA, am acting as Energy manager for [employer], DO.  I have reviewed the above documentation for accuracy and completeness, and I agree with the above. Carlye Grippe, D.O.  The 21st Century Cures Act was signed into law in 2016 which includes the topic of electronic health records.  This  provides immediate access to information in MyChart.  This includes consultation notes, operative notes, office notes, lab results and pathology reports.  If you have any questions about what you read please let us know at your next visit so we can discuss your concerns and take corrective action if need be.  We are right here with you.

## 2020-07-15 ENCOUNTER — Other Ambulatory Visit: Payer: Self-pay

## 2020-07-15 ENCOUNTER — Encounter (INDEPENDENT_AMBULATORY_CARE_PROVIDER_SITE_OTHER): Payer: Self-pay | Admitting: Adult Health

## 2020-07-15 ENCOUNTER — Ambulatory Visit (INDEPENDENT_AMBULATORY_CARE_PROVIDER_SITE_OTHER): Payer: Medicare Other | Admitting: Adult Health

## 2020-07-15 VITALS — BP 102/62 | HR 64 | Temp 98.3°F | Ht 65.0 in | Wt 183.0 lb

## 2020-07-15 DIAGNOSIS — R7303 Prediabetes: Secondary | ICD-10-CM | POA: Diagnosis not present

## 2020-07-15 DIAGNOSIS — E7849 Other hyperlipidemia: Secondary | ICD-10-CM

## 2020-07-15 DIAGNOSIS — E6609 Other obesity due to excess calories: Secondary | ICD-10-CM

## 2020-07-15 DIAGNOSIS — Z6834 Body mass index (BMI) 34.0-34.9, adult: Secondary | ICD-10-CM | POA: Diagnosis not present

## 2020-07-15 DIAGNOSIS — E669 Obesity, unspecified: Secondary | ICD-10-CM

## 2020-07-15 DIAGNOSIS — E559 Vitamin D deficiency, unspecified: Secondary | ICD-10-CM

## 2020-07-15 MED ORDER — ROSUVASTATIN CALCIUM 10 MG PO TABS: 10.0000 mg | ORAL_TABLET | Freq: Every day | ORAL | 0 refills | Status: DC

## 2020-07-16 LAB — COMPREHENSIVE METABOLIC PANEL
ALT: 15 IU/L (ref 0–32)
AST: 16 IU/L (ref 0–40)
Albumin/Globulin Ratio: 1.9 (ref 1.2–2.2)
Albumin: 4.8 g/dL (ref 3.8–4.8)
Alkaline Phosphatase: 78 IU/L (ref 44–121)
BUN/Creatinine Ratio: 17 (ref 12–28)
BUN: 16 mg/dL (ref 8–27)
Bilirubin Total: 0.3 mg/dL (ref 0.0–1.2)
CO2: 23 mmol/L (ref 20–29)
Calcium: 9.8 mg/dL (ref 8.7–10.3)
Chloride: 102 mmol/L (ref 96–106)
Creatinine, Ser: 0.96 mg/dL (ref 0.57–1.00)
Globulin, Total: 2.5 g/dL (ref 1.5–4.5)
Glucose: 98 mg/dL (ref 65–99)
Potassium: 5.2 mmol/L (ref 3.5–5.2)
Sodium: 142 mmol/L (ref 134–144)
Total Protein: 7.3 g/dL (ref 6.0–8.5)
eGFR: 66 mL/min/{1.73_m2} (ref >59)

## 2020-07-16 LAB — HEMOGLOBIN A1C
Est. average glucose Bld gHb Est-mCnc: 126 mg/dL
Hgb A1c MFr Bld: 6 % — ABNORMAL HIGH (ref 4.8–5.6)

## 2020-07-16 LAB — INSULIN, RANDOM: INSULIN: 6.4 u[IU]/mL (ref 2.6–24.9)

## 2020-07-16 LAB — LIPID PANEL
Chol/HDL Ratio: 2.6 ratio (ref 0.0–4.4)
Cholesterol, Total: 170 mg/dL (ref 100–199)
HDL: 66 mg/dL (ref >39)
LDL Chol Calc (NIH): 81 mg/dL (ref 0–99)
Triglycerides: 135 mg/dL (ref 0–149)
VLDL Cholesterol Cal: 23 mg/dL (ref 5–40)

## 2020-07-16 LAB — VITAMIN D 25 HYDROXY (VIT D DEFICIENCY, FRACTURES): Vit D, 25-Hydroxy: 52.6 ng/mL (ref 30.0–100.0)

## 2020-07-16 NOTE — Progress Notes (Signed)
Chief Complaint:   OBESITY Carolyn [name] is here to discuss her progress with her obesity treatment plan along with follow-up of her obesity related diagnoses. Carolyn [name] is on keeping a food journal and adhering to recommended goals of 1300-1400 calories and 90 g protein and states she is following her eating plan approximately 85% of the time. Carolyn [name] states she is doing 0 minutes 0 times per week.  Today's visit was #: 8 Starting weight: 207 lbs Starting date: 02/22/2020 Today's weight: 183 lbs Today's date: 07/15/2020 Total lbs lost to date: 23 lbs Total lbs lost since last in-office visit: 3 lbs  Interim History: Carolyn [name] reports tracking intake and hitting goal both at 85% of the time. Her daughter recently purchased her a Fitbit to track activity. Interval Goals- 6,000 steps a day with Fitbit.  Subjective:   1. Vitamin D deficiency Carolyn [name]'s Vitamin D level was 23.9 on 02/22/2020. She is currently taking prescription vitamin D 50,000 IU each week. She denies nausea, vomiting or muscle weakness.   Ref. Range 02/22/2020 10:50  Vitamin D, 25-Hydroxy Latest Ref Range: 30.0 - 100.0 ng/mL 23.9 (L)   2. Other hyperlipidemia Carolyn [name]'s 02/22/2020 CMP- LFTs- WNL Lipid panel- stable  She is on Crestor 10mg  QD-denies myalgia's.  3. Pre-diabetes Carolyn [name]'s 02/22/2020 A1c was 6.1, blood glucose 101, and insulin level 12.2. She is not on Metformin. She has a family history of type 2 diabetes- father and maternal grandmother.  Assessment/Plan:   1. Vitamin D deficiency Low Vitamin D level contributes to fatigue and are associated with obesity, breast, and colon cancer. She agrees to continue to take prescription Vitamin D @50 ,000 IU every week and will follow-up for routine testing of Vitamin D, at least 2-3 times per year to avoid over-replacement. Check labs today.  - VITAMIN D 25 Hydroxy (Vit-D Deficiency, Fractures)  2. Other hyperlipidemia Cardiovascular risk and specific lipid/LDL goals reviewed.   We discussed several lifestyle modifications today and Carolyn [name] will continue to work on diet, exercise and weight loss efforts. Orders and follow up as documented in patient record. Check labs today.  Counseling Intensive lifestyle modifications are the first line treatment for this issue. . Dietary changes: Increase soluble fiber. Decrease simple carbohydrates. . Exercise changes: Moderate to vigorous-intensity aerobic activity 150 minutes per week if tolerated. . Lipid-lowering medications: see documented in medical record.  - rosuvastatin (CRESTOR) 10 MG tablet; Take 1 tablet (10 mg total) by mouth at bedtime.  Dispense: 30 tablet; Refill: 0 - Lipid panel  3. Pre-diabetes Carolyn [name] will continue to work on weight loss, exercise, and decreasing simple carbohydrates to help decrease the risk of diabetes. Check labs today.  - Comprehensive metabolic panel - Hemoglobin A1c - Insulin, random  4. Class 1 obesity due to excess calories with body mass index (BMI) of 30.0 to 30.9 in adult, unspecified whether serious comorbidity present Carolyn [name] is currently in the action stage of change. As such, her goal is to continue with weight loss efforts. She has agreed to keeping a food journal and adhering to recommended goals of 1300-1400 calories and 90 protein.   Exercise goals: Increase daily walking to 6,000 steps a day.  Behavioral modification strategies: increasing lean protein intake, decreasing simple carbohydrates, meal planning and cooking strategies, planning for success and keeping a strict food journal.  Carolyn [name] has agreed to follow-up with our clinic in 2-3 weeks. She was informed of the importance of frequent follow-up visits to maximize her success with intensive lifestyle modifications for her multiple  health conditions.   Objective:   Blood pressure 102/62, pulse 64, temperature 98.3 F (36.8 C), height 5\' 5"  (1.651 m), weight 183 lb (83 kg), SpO2 95 %. Body mass index is 30.45  kg/m.  General: Cooperative, alert, well developed, in no acute distress. HEENT: Conjunctivae and lids unremarkable. Cardiovascular: Regular rhythm.  Lungs: Normal work of breathing. Neurologic: No focal deficits.   Lab Results  Component Value Date   CREATININE 0.96 07/15/2020   BUN 16 07/15/2020   NA 142 07/15/2020   K 5.2 07/15/2020   CL 102 07/15/2020   CO2 23 07/15/2020   Lab Results  Component Value Date   ALT 15 07/15/2020   AST 16 07/15/2020   ALKPHOS 78 07/15/2020   BILITOT 0.3 07/15/2020   Lab Results  Component Value Date   HGBA1C 6.0 (H) 07/15/2020   HGBA1C 6.1 (H) 02/22/2020   Lab Results  Component Value Date   INSULIN 6.4 07/15/2020   INSULIN 12.2 02/22/2020   Lab Results  Component Value Date   TSH 0.974 02/22/2020   Lab Results  Component Value Date   CHOL 170 07/15/2020   HDL 66 07/15/2020   LDLCALC 81 07/15/2020   TRIG 135 07/15/2020   CHOLHDL 2.6 07/15/2020   Lab Results  Component Value Date   WBC 8.5 02/22/2020   HGB 13.5 02/22/2020   HCT 41.8 02/22/2020   MCV 92 02/22/2020   PLT 335 02/22/2020   No results found for: IRON, TIBC, FERRITIN  Obesity Behavioral Intervention:   Approximately 15 minutes were spent on the discussion below.  ASK: We discussed the diagnosis of obesity with Carolyn [name] today and Carolyn [name] agreed to give 02/24/2020 permission to discuss obesity behavioral modification therapy today.  ASSESS: Carolyn [name] has the diagnosis of obesity and her BMI today is 30.5. Carolyn [name] is in the action stage of change.   ADVISE: Carolyn [name] was educated on the multiple health risks of obesity as well as the benefit of weight loss to improve her health. She was advised of the need for long term treatment and the importance of lifestyle modifications to improve her current health and to decrease her risk of future health problems.  AGREE: Multiple dietary modification options and treatment options were discussed and Carolyn [name] agreed to follow the  recommendations documented in the above note.  ARRANGE: Carolyn [name] was educated on the importance of frequent visits to treat obesity as outlined per CMS and USPSTF guidelines and agreed to schedule her next follow up appointment today.  Attestation Statements:   Reviewed by clinician on day of visit: allergies, medications, problem list, medical history, surgical history, family history, social history, and previous encounter notes.  Sedalia Muta, am acting as Edmund Hilda for Energy manager, NP.  I have reviewed the above documentation for accuracy and completeness, and I agree with the above. -  Carolyn [name] d. Kaiya Boatman, NP-C

## 2020-07-29 ENCOUNTER — Ambulatory Visit (INDEPENDENT_AMBULATORY_CARE_PROVIDER_SITE_OTHER): Payer: Medicare Other | Admitting: Family Medicine

## 2020-07-29 ENCOUNTER — Encounter (INDEPENDENT_AMBULATORY_CARE_PROVIDER_SITE_OTHER): Payer: Self-pay | Admitting: Family Medicine

## 2020-07-29 ENCOUNTER — Other Ambulatory Visit: Payer: Self-pay

## 2020-07-29 VITALS — BP 117/74 | HR 77 | Temp 98.2°F | Ht 65.0 in | Wt 181.0 lb

## 2020-07-29 DIAGNOSIS — E7849 Other hyperlipidemia: Secondary | ICD-10-CM | POA: Diagnosis not present

## 2020-07-29 DIAGNOSIS — R7303 Prediabetes: Secondary | ICD-10-CM | POA: Diagnosis not present

## 2020-07-29 DIAGNOSIS — E669 Obesity, unspecified: Secondary | ICD-10-CM | POA: Diagnosis not present

## 2020-07-29 DIAGNOSIS — Z6834 Body mass index (BMI) 34.0-34.9, adult: Secondary | ICD-10-CM | POA: Diagnosis not present

## 2020-07-29 DIAGNOSIS — E559 Vitamin D deficiency, unspecified: Secondary | ICD-10-CM

## 2020-07-29 DIAGNOSIS — Z9189 Other specified personal risk factors, not elsewhere classified: Secondary | ICD-10-CM

## 2020-07-29 NOTE — Patient Instructions (Signed)
The 10-year ASCVD risk score Denman George DC Montez Hageman., et al., 2013) is: 4%   Values used to calculate the score:     Age: 65 years     Sex: Female     Is Non-Hispanic African American: No     Diabetic: No     Tobacco smoker: No     Systolic Blood Pressure: 117 mmHg     Is BP treated: No     HDL Cholesterol: 66 mg/dL     Total Cholesterol: 170 mg/dL

## 2020-07-30 MED ORDER — VITAMIN D (ERGOCALCIFEROL) 1.25 MG (50000 UNIT) PO CAPS: 50000.0000 [IU] | ORAL_CAPSULE | ORAL | 0 refills | Status: DC

## 2020-07-30 MED ORDER — ROSUVASTATIN CALCIUM 10 MG PO TABS: 10.0000 mg | ORAL_TABLET | Freq: Every day | ORAL | 0 refills | Status: DC

## 2020-08-06 NOTE — Progress Notes (Signed)
Chief Complaint:   OBESITY Carolyn [name] is here to discuss her progress with her obesity treatment plan along with follow-up of her obesity related diagnoses.   Today's visit was #: 9 Starting weight: 207 lbs Starting date: 02/22/2020 Today's weight: 181 lbs Today's date: 07/29/2020 Total lbs lost to date: 26 lbs Body mass index is 30.12 kg/m.  Total weight loss percentage to date: -12.56%  Interim History:  Carolyn [name] is here for a follow up office visit and she is following the meal plan without concerns or issues.  Patient's meal and food recall appears to be accurate and consistent with what is on the plan.  When on plan, her hunger and cravings are well controlled.    Carolyn [name] signed up for the [facility] just before her office visit today.  Current Meal Plan: keeping a food journal and adhering to recommended goals of 1300-1400 calories and 90+ grams of protein for 90% of the time.  Current Exercise Plan: Walking for 10 minutes 2 times per week.  Assessment/Plan:   No orders of the defined types were placed in this encounter.   Medications Discontinued During This Encounter  Medication Reason  . Vitamin D, Ergocalciferol, (DRISDOL) 1.25 MG (50000 UNIT) CAPS capsule Reorder  . rosuvastatin (CRESTOR) 10 MG tablet Reorder     Meds ordered this encounter  Medications  . rosuvastatin (CRESTOR) 10 MG tablet    Sig: Take 1 tablet (10 mg total) by mouth at bedtime.    Dispense:  30 tablet    Refill:  0  . Vitamin D, Ergocalciferol, (DRISDOL) 1.25 MG (50000 UNIT) CAPS capsule    Sig: Take 1 capsule (50,000 Units total) by mouth every 7 (seven) days.    Dispense:  4 capsule    Refill:  0     1. Prediabetes Not at goal. Goal is HgbA1c < 5.7.  Medication: None.    Plan:  Discussed labs with patient today.  She will continue to focus on protein-rich, low simple carbohydrate foods. We reviewed the importance of hydration, regular exercise for stress reduction, and restorative  sleep.   Lab Results  Component Value Date   HGBA1C 6.0 (H) 07/15/2020   Lab Results  Component Value Date   INSULIN 6.4 07/15/2020   INSULIN 12.2 02/22/2020   2. Other hyperlipidemia Course: Controlled. Lipid-lowering medications: Crestor 10 mg daily.  She was given Crestor on 03/07/2020.  Increased dose on 04/03/2020 to 10 mg daily.  Plan:  Discussed labs with patient today.  Continue Crestor.  Will refill today, as per below.  Dietary changes: Increase soluble fiber, decrease simple carbohydrates, decrease saturated fat. Exercise changes: Moderate to vigorous-intensity aerobic activity 150 minutes per week or as tolerated. We will continue to monitor along with PCP/specialists as it pertains to her weight loss journey.  Lab Results  Component Value Date   CHOL 170 07/15/2020   HDL 66 07/15/2020   LDLCALC 81 07/15/2020   TRIG 135 07/15/2020   CHOLHDL 2.6 07/15/2020   Lab Results  Component Value Date   ALT 15 07/15/2020   AST 16 07/15/2020   ALKPHOS 78 07/15/2020   BILITOT 0.3 07/15/2020   The 10-year ASCVD risk score Denman George DC Jr., et al., 2013) is: 3.8%   Values used to calculate the score:     Age: 24 years     Sex: Female     Is Non-Hispanic African American: No     Diabetic: No     Tobacco smoker:  No     Systolic Blood Pressure: 114 mmHg     Is BP treated: No     HDL Cholesterol: 66 mg/dL     Total Cholesterol: 170 mg/dL  - Refill rosuvastatin (CRESTOR) 10 MG tablet; Take 1 tablet (10 mg total) by mouth at bedtime.  Dispense: 30 tablet; Refill: 0  3. Vitamin D deficiency At goal. Current vitamin D is 52.6, tested on 07/15/2020. Optimal goal > 50 ng/dL. She is taking vitamin D 50,000 IU weekly.  Plan: Discussed labs with patient today.  - Reiterated importance of vitamin D (as well as calcium) to their health and wellbeing.  - Reminded Carolyn [name] that weight loss will likely improve availability of vitamin D, thus encouraged her to continue with meal plan and  their weight loss efforts to further improve this condition. - I recommend patient continue to take weekly prescription vit D 50,000 IU - Informed patient this may be a lifelong thing, and she was encouraged to continue to take the medicine until told otherwise.   - we will need to monitor levels regularly (every 3-4 mo on average) to keep levels within normal limits.  - weight loss will likely improve availability of vitamin D, thus encouraged Carolyn [name] to continue with meal plan and their weight loss efforts to further improve this condition - pt's questions and concerns regarding this condition addressed.  - Refill Vitamin D, Ergocalciferol, (DRISDOL) 1.25 MG (50000 UNIT) CAPS capsule; Take 1 capsule (50,000 Units total) by mouth every 7 (seven) days.  Dispense: 4 capsule; Refill: 0  4. Obesity, current BMI 30.3  Course: [name] is currently in the action stage of change. As such, her goal is to continue with weight loss efforts.   Nutrition goals: She has agreed to keeping a food journal and adhering to recommended goals of 1300-1400 calories and 90+ grams of protein.   Exercise goals: As is.  Increase as tolerated.  Behavioral modification strategies: increasing lean protein intake, decreasing simple carbohydrates, increasing water intake, meal planning and cooking strategies and planning for success.  Kerline has agreed to follow-up with our clinic in 3 weeks. She was informed of the importance of frequent follow-up visits to maximize her success with intensive lifestyle modifications for her multiple health conditions.   Objective:   Blood pressure 117/74, pulse 77, temperature 98.2 F (36.8 C), height 5\' 5"  (1.651 m), weight 181 lb (82.1 kg), SpO2 96 %. Body mass index is 30.12 kg/m.  General: Cooperative, alert, well developed, in no acute distress. HEENT: Conjunctivae and lids unremarkable. Cardiovascular: Regular rhythm.  Lungs: Normal work of breathing. Neurologic: No focal  deficits.   Lab Results  Component Value Date   CREATININE 0.96 07/15/2020   BUN 16 07/15/2020   NA 142 07/15/2020   K 5.2 07/15/2020   CL 102 07/15/2020   CO2 23 07/15/2020   Lab Results  Component Value Date   ALT 15 07/15/2020   AST 16 07/15/2020   ALKPHOS 78 07/15/2020   BILITOT 0.3 07/15/2020   Lab Results  Component Value Date   HGBA1C 6.0 (H) 07/15/2020   HGBA1C 6.1 (H) 02/22/2020   Lab Results  Component Value Date   INSULIN 6.4 07/15/2020   INSULIN 12.2 02/22/2020   Lab Results  Component Value Date   TSH 0.974 02/22/2020   Lab Results  Component Value Date   CHOL 170 07/15/2020   HDL 66 07/15/2020   LDLCALC 81 07/15/2020   TRIG 135 07/15/2020   CHOLHDL  2.6 07/15/2020   Lab Results  Component Value Date   WBC 8.5 02/22/2020   HGB 13.5 02/22/2020   HCT 41.8 02/22/2020   MCV 92 02/22/2020   PLT 335 02/22/2020   Obesity Behavioral Intervention:   Approximately 15 minutes were spent on the discussion below.  ASK: We discussed the diagnosis of obesity with Richell today and Kaisey agreed to give [name] permission to discuss obesity behavioral modification therapy today.  ASSESS: Dahna has the diagnosis of obesity and her BMI today is 30.3. Traeh is in the action stage of change.   ADVISE: Sharnell was educated on the multiple health risks of obesity as well as the benefit of weight loss to improve her health. She was advised of the need for long term treatment and the importance of lifestyle modifications to improve her current health and to decrease her risk of future health problems.  AGREE: Multiple dietary modification options and treatment options were discussed and Clarivel agreed to follow the recommendations documented in the above note.  ARRANGE: Nicloe was educated on the importance of frequent visits to treat obesity as outlined per CMS and USPSTF guidelines and agreed to schedule her next follow up appointment today.  Attestation Statements:    Reviewed by clinician on day of visit: allergies, medications, problem list, medical history, surgical history, family history, social history, and previous encounter notes.  I, Insurance claims handler, CMA, am acting as Energy manager for [employer], DO.  I have reviewed the above documentation for accuracy and completeness, and I agree with the above. Carlye Grippe, D.O.  The 21st Century Cures Act was signed into law in 2016 which includes the topic of electronic health records.  This provides immediate access to information in MyChart.  This includes consultation notes, operative notes, office notes, lab results and pathology reports.  If you have any questions about what you read please let us know at your next visit so we can discuss your concerns and take corrective action if need be.  We are right here with you.

## 2020-08-15 ENCOUNTER — Encounter (INDEPENDENT_AMBULATORY_CARE_PROVIDER_SITE_OTHER): Payer: Self-pay | Admitting: Adult Health

## 2020-08-15 ENCOUNTER — Other Ambulatory Visit: Payer: Self-pay

## 2020-08-15 ENCOUNTER — Ambulatory Visit (INDEPENDENT_AMBULATORY_CARE_PROVIDER_SITE_OTHER): Payer: Medicare Other | Admitting: Adult Health

## 2020-08-15 VITALS — BP 114/77 | HR 79 | Temp 98.3°F | Ht 65.0 in | Wt 180.0 lb

## 2020-08-15 DIAGNOSIS — Z6830 Body mass index (BMI) 30.0-30.9, adult: Secondary | ICD-10-CM

## 2020-08-15 DIAGNOSIS — E6609 Other obesity due to excess calories: Secondary | ICD-10-CM | POA: Diagnosis not present

## 2020-08-15 DIAGNOSIS — E7849 Other hyperlipidemia: Secondary | ICD-10-CM | POA: Diagnosis not present

## 2020-08-15 DIAGNOSIS — E559 Vitamin D deficiency, unspecified: Secondary | ICD-10-CM

## 2020-08-15 DIAGNOSIS — E669 Obesity, unspecified: Secondary | ICD-10-CM

## 2020-08-19 NOTE — Progress Notes (Signed)
Chief Complaint:   OBESITY Carolyn [name] is here to discuss her progress with her obesity treatment plan along with follow-up of her obesity related diagnoses. Carolyn [name] is on keeping a food journal and adhering to recommended goals of 1300-1400 calories and 90+ g protein and states she is following her eating plan approximately 40% of the time. Carolyn [name] states she is not currently exercising.  Today's visit was #: 10 Starting weight: 207 lbs Starting date: 02/22/2020 Today's weight: 180 lbs Today's date: 08/15/2020 Total lbs lost to date: 27 lbs Total lbs lost since last in-office visit: 1  Interim History: The following occurred the last few weeks- company from out of town, [employer], celebrated granddaughter's birthday, event at church, and Carolyn [name] was still able to lose 1 lb. She will consistently track intake at meals and when she snacking.  Subjective:   1. Vitamin D deficiency [name]'s Vitamin D level was 52.6 on 07/15/2020. She is currently taking prescription vitamin D 50,000 IU each week. She denies nausea, vomiting or muscle weakness.  2. Other hyperlipidemia [name] is on Crestor 10 mg QD. She denies myalgias. Her last lipid panel on 07/15/2020 is much improved from previous check on 02/22/2020.  Lab Results  Component Value Date   ALT 15 07/15/2020   AST 16 07/15/2020   ALKPHOS 78 07/15/2020   BILITOT 0.3 07/15/2020   Lab Results  Component Value Date   CHOL 170 07/15/2020   HDL 66 07/15/2020   LDLCALC 81 07/15/2020   TRIG 135 07/15/2020   CHOLHDL 2.6 07/15/2020    Assessment/Plan:   1. Vitamin D deficiency Low Vitamin D level contributes to fatigue and are associated with obesity, breast, and colon cancer. She agrees to continue to take prescription Vitamin D @50 ,000 IU every week and will follow-up for routine testing of Vitamin D, at least 2-3 times per year to avoid over-replacement. Check labs in 2-3 months.  2. Other hyperlipidemia Cardiovascular risk and specific  lipid/LDL goals reviewed.  We discussed several lifestyle modifications today and Litha will continue to work on diet, exercise and weight loss efforts. Orders and follow up as documented in patient record. Continue statin therapy as directed.  Remain well hydrated with water.  Counseling Intensive lifestyle modifications are the first line treatment for this issue. . Dietary changes: Increase soluble fiber. Decrease simple carbohydrates. . Exercise changes: Moderate to vigorous-intensity aerobic activity 150 minutes per week if tolerated. . Lipid-lowering medications: see documented in medical record.  3. Obesity with current BMI 30.0 Carolyn [name] is currently in the action stage of change. As such, her goal is to continue with weight loss efforts. She has agreed to keeping a food journal and adhering to recommended goals of 1300-1400 calories and 90 protein.   Exercise goals: Carolyn [name] signed up for [facility].  Behavioral modification strategies: increasing lean protein intake, decreasing simple carbohydrates, meal planning and cooking strategies, keeping healthy foods in the home, planning for success and keeping a strict food journal.  Carolyn [name] has agreed to follow-up with our clinic in 2 weeks. She was informed of the importance of frequent follow-up visits to maximize her success with intensive lifestyle modifications for her multiple health conditions.   Objective:   Blood pressure 114/77, pulse 79, temperature 98.3 F (36.8 C), height 5\' 5"  (1.651 m), weight 180 lb (81.6 kg), SpO2 98 %. Body mass index is 29.95 kg/m.  General: Cooperative, alert, well developed, in no acute distress. HEENT: Conjunctivae and lids unremarkable. Cardiovascular: Regular rhythm.  Lungs:  Normal work of breathing. Neurologic: No focal deficits.   Lab Results  Component Value Date   CREATININE 0.96 07/15/2020   BUN 16 07/15/2020   NA 142 07/15/2020   K 5.2 07/15/2020   CL 102 07/15/2020   CO2 23 07/15/2020    Lab Results  Component Value Date   ALT 15 07/15/2020   AST 16 07/15/2020   ALKPHOS 78 07/15/2020   BILITOT 0.3 07/15/2020   Lab Results  Component Value Date   HGBA1C 6.0 (H) 07/15/2020   HGBA1C 6.1 (H) 02/22/2020   Lab Results  Component Value Date   INSULIN 6.4 07/15/2020   INSULIN 12.2 02/22/2020   Lab Results  Component Value Date   TSH 0.974 02/22/2020   Lab Results  Component Value Date   CHOL 170 07/15/2020   HDL 66 07/15/2020   LDLCALC 81 07/15/2020   TRIG 135 07/15/2020   CHOLHDL 2.6 07/15/2020   Lab Results  Component Value Date   WBC 8.5 02/22/2020   HGB 13.5 02/22/2020   HCT 41.8 02/22/2020   MCV 92 02/22/2020   PLT 335 02/22/2020   No results found for: IRON, TIBC, FERRITIN  Obesity Behavioral Intervention:   Approximately 15 minutes were spent on the discussion below.  ASK: We discussed the diagnosis of obesity with Carolyn [name] today and Carolyn [name] agreed to give Carolyn [name] permission to discuss obesity behavioral modification therapy today.  ASSESS: Carolyn [name] has the diagnosis of obesity and her BMI today is 30. Carolyn [name] is in the action stage of change.   ADVISE: Carolyn [name] was educated on the multiple health risks of obesity as well as the benefit of weight loss to improve her health. She was advised of the need for long term treatment and the importance of lifestyle modifications to improve her current health and to decrease her risk of future health problems.  AGREE: Multiple dietary modification options and treatment options were discussed and Carolyn [name] agreed to follow the recommendations documented in the above note.  ARRANGE: Carolyn [name] was educated on the importance of frequent visits to treat obesity as outlined per CMS and USPSTF guidelines and agreed to schedule her next follow up appointment today.  Attestation Statements:   Reviewed by clinician on day of visit: allergies, medications, problem list, medical history, surgical history, family history, social  history, and previous encounter notes.  Edmund Hilda, am acting as Energy manager for Carolyn Hamburger, NP.  I have reviewed the above documentation for accuracy and completeness, and I agree with the above. -  Gesselle Fitzsimons d. Akito Boomhower, NP-C

## 2020-09-06 ENCOUNTER — Other Ambulatory Visit: Payer: Self-pay

## 2020-09-06 ENCOUNTER — Emergency Department
Admission: RE | Admit: 2020-09-06 | Discharge: 2020-09-06 | Disposition: A | Discharge status: Home / Self Care | Payer: Medicare Other | Source: Ambulatory Visit | Attending: Family Medicine | Admitting: Family Medicine

## 2020-09-06 VITALS — BP 123/83 | HR 64 | Temp 98.9°F | Resp 17

## 2020-09-06 DIAGNOSIS — H1131 Conjunctival hemorrhage, right eye: Secondary | ICD-10-CM | POA: Diagnosis not present

## 2020-09-06 NOTE — ED Provider Notes (Signed)
[facility]    CSN: [id number] Arrival date & time: 09/06/20  1259      History   Chief Complaint Chief Complaint  Patient presents with  . Eye Problem  . Appointment    HPI Carolyn [name] is a 65 y.o. female.   Patient awoke with redness in her right medial eye today.  The area has been slightly uncomfortable but not painful.  She recalls no injury, denies foreign body sensation, and has had no changes in vision.  She does not wear contacts.  The history is provided by the patient.  Eye Problem Location:  Right eye Quality: mildly uncomfortable. Severity:  Mild Onset quality:  Sudden Timing:  Constant Progression:  Unchanged Chronicity:  New Context: not burn, not chemical exposure, not contact lens problem, not direct trauma, not foreign body, not scratch, not smoke exposure and not UV exposure   Relieved by:  None tried Worsened by:  Nothing Ineffective treatments:  None tried Associated symptoms: redness   Associated symptoms: no blurred vision, no crusting, no decreased vision, no discharge, no double vision, no headaches, no itching, no photophobia, no scotomas, no swelling and no tearing   Risk factors: no recent URI     Past Medical History:  Diagnosis Date  . Anemia   . Anxiety   . Chest pain   . Chronic pain of both feet   . Depression   . Fibromyalgia   . Flat feet   . High cholesterol   . Joint pain   . Knee pain, bilateral   . Obesity   . Osteoarthritis    neck  . SOB (shortness of breath) on exertion   . Vitamin D deficiency     Patient Active Problem List   Diagnosis Date Noted  . At risk for osteoporosis 05/30/2020  . Osteopenia 05/29/2020  . At risk for side effect of medication 04/22/2020  . Prediabetes 03/07/2020  . Other hyperlipidemia 03/07/2020  . At risk for diabetes mellitus 03/07/2020  . Other fatigue 02/22/2020  . SOB (shortness of breath) on exertion 02/22/2020  . Osteoarthritis 02/22/2020  . Mood disorder  (HCC) 02/22/2020  . At risk for deficient intake of food 02/22/2020  . Anemia   . Menopause present 03/03/2019  . Endometriosis 03/03/2019  . Abnormal weight gain 01/30/2016  . Dry eye syndrome 01/18/2015  . Mixed hyperlipidemia 05/15/2013  . Neck pain, musculoskeletal 06/13/2012  . Class 1 obesity due to excess calories with body mass index (BMI) of 30.0 to 30.9 in adult 12/10/2011  . Vitamin D deficiency 06/04/2010  . DEPRESSION/ANXIETY 04/02/2010  . OVERACTIVE BLADDER 07/31/2009  . Fibromyalgia 07/17/2009  . FOOT PAIN, BILATERAL 07/17/2009  . STRESS INCONTINENCE 07/17/2009    Past Surgical History:  Procedure Laterality Date  . TONSILLECTOMY  15  . TONSILLECTOMY    . TOTAL ABDOMINAL HYSTERECTOMY  10/90   has her left ovary    OB History    Gravida  4   Para  4   Term      Preterm      AB      Living        SAB      IAB      Ectopic      Multiple      Live Births               Home Medications    Prior to Admission medications   Medication Sig Start Date End Date  Taking? Authorizing Provider  Ascorbic Acid (VITAMIN C PO) Take 1,000 mg by mouth daily.   Yes [provider]  cycloSPORINE (RESTASIS) 0.05 % ophthalmic emulsion 1 drop 2 (two) times daily.   Yes [provider]  Multiple Vitamins-Minerals (ZINC PO) Take by mouth. 22 mg/800 mg daily   Yes [provider]  rosuvastatin (CRESTOR) 10 MG tablet Take 1 tablet (10 mg total) by mouth at bedtime. 07/30/20  Yes Opalski, Gavin Pound, DO  sertraline (ZOLOFT) 100 MG tablet Take 1 tablet (100 mg total) by mouth daily. Patient taking differently: Take 50 mg by mouth daily. 1/2 tablet qd 04/01/20  Yes Agapito Games, MD  Vitamin D, Ergocalciferol, (DRISDOL) 1.25 MG (50000 UNIT) CAPS capsule Take 1 capsule (50,000 Units total) by mouth every 7 (seven) days. 07/30/20  Yes Opalski, Gavin Pound, DO  Cholecalciferol (VITAMIN D-3 PO) Take 5,000 Units by mouth daily. Patient not  taking: Reported on 09/06/2020    [provider]  cyclobenzaprine (FLEXERIL) 5 MG tablet TAKE 1 OR 2 TABLETS BY MOUTH DAILY AT BEDTIME AS NEEDED FOR MUSCLE SPASMS 07/14/19   Agapito Games, MD  sertraline (ZOLOFT) 25 MG tablet Take 1 tablet (25 mg total) by mouth daily. Patient not taking: Reported on 09/06/2020 05/21/20   Agapito Games, MD    Family History Family History  Problem Relation Age of Onset  . Cancer Other        breast  . Diabetes Father   . Heart disease Father   . Obesity Father   . Heart attack Brother   . Breast cancer Mother 42  . Cancer Mother   . Lung cancer Brother     Social History Social History   Tobacco Use  . Smoking status: Never Smoker  . Smokeless tobacco: Never Used  Vaping Use  . Vaping Use: Never used  Substance Use Topics  . Alcohol use: No  . Drug use: No     Allergies   Patient has no known allergies.   Review of Systems Review of Systems  Eyes: Positive for redness. Negative for blurred vision, double vision, photophobia, pain, discharge, itching and visual disturbance.  Neurological: Negative for headaches.  All other systems reviewed and are negative.    Physical Exam Triage Vital Signs ED Triage Vitals  Enc Vitals Group     BP 09/06/20 1401 123/83     Pulse Rate 09/06/20 1401 64     Resp 09/06/20 1401 17     Temp 09/06/20 1401 98.9 F (37.2 C)     Temp Source 09/06/20 1401 Oral     SpO2 09/06/20 1401 99 %     Weight --      Height --      Head Circumference --      Peak Flow --      Pain Score 09/06/20 1402 0     Pain Loc --      Pain Edu? --      Excl. in GC? --    No data found.  Updated Vital Signs BP 123/83 (BP Location: Right Arm)   Pulse 64   Temp 98.9 F (37.2 C) (Oral)   Resp 17   SpO2 99%   Visual Acuity Right Eye Distance: 20/30 Left Eye Distance: 20/30 Bilateral Distance: 20/30  Right Eye Near:   Left Eye Near:    Bilateral Near:     Physical Exam Vitals  reviewed.  Constitutional:      General: She is  not in acute distress. HENT:     Head: Normocephalic.     Right Ear: External ear normal.     Left Ear: External ear normal.     Nose: Nose normal.     Mouth/Throat:     Pharynx: Oropharynx is clear.  Eyes:     General: Lids are normal. Lids are everted, no foreign bodies appreciated. Vision grossly intact. Gaze aligned appropriately.        Right eye: No foreign body, discharge or hordeolum.        Left eye: No discharge.     Extraocular Movements: Extraocular movements intact.     Conjunctiva/sclera:     Right eye: Right conjunctiva is not injected. Hemorrhage present. No chemosis or exudate.    Left eye: Left conjunctiva is not injected. No chemosis, exudate or hemorrhage.    Pupils: Pupils are equal, round, and reactive to light.      Comments: Right fundi benign.  No photophobia.  Fluorescein to right eye:  No uptake.  Cardiovascular:     Rate and Rhythm: Normal rate.  Pulmonary:     Effort: Pulmonary effort is normal.  Lymphadenopathy:     Cervical: No cervical adenopathy.  Skin:    General: Skin is warm and dry.     Findings: No rash.  Neurological:     Mental Status: She is alert and oriented to person, place, and time.      UC Treatments / Results  Labs (all labs ordered are listed, but only abnormal results are displayed) Labs Reviewed - No data to display  EKG   Radiology No results found.  Procedures Procedures (including critical care time)  Medications Ordered in UC Medications - No data to display  Initial Impression / Assessment and Plan / UC Course  I have reviewed the triage vital signs and the nursing notes.  Pertinent labs & imaging results that were available during my care of the patient were reviewed by me and considered in my medical decision making (see chart for details).    Benign exam.  Reassurance. Followup with ophthalmologist if not improving.  Final Clinical Impressions(s) / UC  Diagnoses   Final diagnoses:  Subconjunctival hemorrhage of right eye     Discharge Instructions     You may use non-medicated lubricating eye drops as needed.  Contact a health care provider if:  You have pain in your eye.  The bleeding does not go away within 3 weeks.  You keep getting new subconjunctival hemorrhages. Get help right away if:  Your vision changes or you have difficulty seeing.  You suddenly develop severe sensitivity to light.  You develop a severe headache, persistent vomiting, confusion, or abnormal tiredness (lethargy).  Your eye seems to bulge or protrude from your eye socket.  You develop unexplained bruises on your body.  You have unexplained bleeding in another area of your body.    ED Prescriptions    None        Lattie Haw, MD 09/07/20 1517

## 2020-09-06 NOTE — Discharge Instructions (Addendum)
You may use non-medicated lubricating eye drops as needed.  Contact a health care provider if: You have pain in your eye. The bleeding does not go away within 3 weeks. You keep getting new subconjunctival hemorrhages. Get help right away if: Your vision changes or you have difficulty seeing. You suddenly develop severe sensitivity to light. You develop a severe headache, persistent vomiting, confusion, or abnormal tiredness (lethargy). Your eye seems to bulge or protrude from your eye socket. You develop unexplained bruises on your body. You have unexplained bleeding in another area of your body.

## 2020-09-06 NOTE — ED Triage Notes (Signed)
Pt noted red in R eye this am  Denies any injury  Denies vision changes   Denies cough

## 2020-09-09 ENCOUNTER — Encounter (INDEPENDENT_AMBULATORY_CARE_PROVIDER_SITE_OTHER): Payer: Self-pay | Admitting: Family Medicine

## 2020-09-09 ENCOUNTER — Other Ambulatory Visit: Payer: Self-pay

## 2020-09-09 ENCOUNTER — Ambulatory Visit (INDEPENDENT_AMBULATORY_CARE_PROVIDER_SITE_OTHER): Payer: Medicare Other | Admitting: Family Medicine

## 2020-09-09 VITALS — BP 121/70 | HR 69 | Temp 97.6°F | Ht 65.0 in | Wt 181.0 lb

## 2020-09-09 DIAGNOSIS — E7849 Other hyperlipidemia: Secondary | ICD-10-CM | POA: Diagnosis not present

## 2020-09-09 DIAGNOSIS — E669 Obesity, unspecified: Secondary | ICD-10-CM

## 2020-09-09 DIAGNOSIS — Z6834 Body mass index (BMI) 34.0-34.9, adult: Secondary | ICD-10-CM | POA: Diagnosis not present

## 2020-09-09 DIAGNOSIS — E559 Vitamin D deficiency, unspecified: Secondary | ICD-10-CM

## 2020-09-09 DIAGNOSIS — Z9189 Other specified personal risk factors, not elsewhere classified: Secondary | ICD-10-CM | POA: Insufficient documentation

## 2020-09-09 MED ORDER — VITAMIN D (ERGOCALCIFEROL) 1.25 MG (50000 UNIT) PO CAPS: 50000.0000 [IU] | ORAL_CAPSULE | ORAL | 0 refills | Status: DC

## 2020-09-09 MED ORDER — ROSUVASTATIN CALCIUM 10 MG PO TABS: 10.0000 mg | ORAL_TABLET | Freq: Every day | ORAL | 0 refills | Status: DC

## 2020-09-17 NOTE — Progress Notes (Signed)
Chief Complaint:   OBESITY Carolyn [name] is here to discuss her progress with her obesity treatment plan along with follow-up of her obesity related diagnoses.   Today's visit was #: 11 Starting weight: 207 lbs Starting date: 02/22/2020 Today's weight: 181 lbs Today's date: 09/09/2020 Weight change since last visit: +1 lb Total lbs lost to date: 26 lbs Body mass index is 30.12 kg/m.  Total weight loss percentage to date: -12.56%  Interim History:  Carolyn [name] is up 1 pound today.  She started going to the [facility] 3 weeks ago.  She says she has been doing more exercise than usual.  She reports that 70% of the time she is hitting her journaling goals, and this week she has a lot of family gatherings/celebrations.  She is drinking 70-80 ounces of water per day.  Current Meal Plan: keeping a food journal and adhering to recommended goals of 1300-1400 calories and 90 grams of protein for 80% of the time.  Current Exercise Plan: Water aerobics for 60 minutes 3 times per week.  Assessment/Plan:   Medications Discontinued During This Encounter  Medication Reason  . rosuvastatin (CRESTOR) 10 MG tablet Reorder  . Vitamin D, Ergocalciferol, (DRISDOL) 1.25 MG (50000 UNIT) CAPS capsule Reorder     Meds ordered this encounter  Medications  . Vitamin D, Ergocalciferol, (DRISDOL) 1.25 MG (50000 UNIT) CAPS capsule    Sig: Take 1 capsule (50,000 Units total) by mouth every 7 (seven) days.    Dispense:  4 capsule    Refill:  0  . rosuvastatin (CRESTOR) 10 MG tablet    Sig: Take 1 tablet (10 mg total) by mouth at bedtime.    Dispense:  30 tablet    Refill:  0    1. Other hyperlipidemia Course: Controlled. Lipid-lowering medications: Crestor 10 mg daily.  Tolerating well, without side effects or concerns.  Plan:  Discussed labs with patient today.  Will refill Crestor today.  FLP reviewed ~1 month ago.  Much improved from prior.  Continue prudent nutritional plan and increase exercise as well. Dietary  changes: Increase soluble fiber, decrease simple carbohydrates, decrease saturated fat. Exercise changes: Moderate to vigorous-intensity aerobic activity 150 minutes per week or as tolerated. We will continue to monitor along with PCP/specialists as it pertains to her weight loss journey.  Lab Results  Component Value Date   CHOL 170 07/15/2020   HDL 66 07/15/2020   LDLCALC 81 07/15/2020   TRIG 135 07/15/2020   CHOLHDL 2.6 07/15/2020   Lab Results  Component Value Date   ALT 15 07/15/2020   AST 16 07/15/2020   ALKPHOS 78 07/15/2020   BILITOT 0.3 07/15/2020   The 10-year ASCVD risk score Denman George DC Jr., et al., 2013) is: 4.3%   Values used to calculate the score:     Age: 75 years     Sex: Female     Is Non-Hispanic African American: No     Diabetic: No     Tobacco smoker: No     Systolic Blood Pressure: 121 mmHg     Is BP treated: No     HDL Cholesterol: 66 mg/dL     Total Cholesterol: 170 mg/dL  - Refill rosuvastatin (CRESTOR) 10 MG tablet; Take 1 tablet (10 mg total) by mouth at bedtime.  Dispense: 30 tablet; Refill: 0  2. Vitamin D deficiency At goal. Current vitamin D is 52.6, tested on 07/15/2020. Optimal goal > 50 ng/dL.  She is taking vitamin D 50,000 IU  weekly.  No issues or side effects.  Compliance is good.  Plan: Continue to take prescription Vitamin D @50 ,000 IU every week as prescribed.  Follow-up for routine testing of Vitamin D, at least 2-3 times per year to avoid over-replacement.  [name] will let know in the future if she would like [name] to send to a different pharmacy using [facility] or not.  Add weight bearing exercises today when not in the pool.  - Refill Vitamin D, Ergocalciferol, (DRISDOL) 1.25 MG (50000 UNIT) CAPS capsule; Take 1 capsule (50,000 Units total) by mouth every 7 (seven) days.  Dispense: 4 capsule; Refill: 0  3. Obesity, current BMI 30.3  Course: Carolyn [name] is currently in the action stage of change. As such, her goal is to continue with weight loss  efforts.   Nutrition goals: She has agreed to keeping a food journal and adhering to recommended goals of 1300-1400 calories and 90 grams of protein.   Exercise goals: As is.  Behavioral modification strategies: keeping a strict food journal.  A[place]a has agreed to follow-up with our clinic in 4 weeks, per patient preference. She was informed of the importance of frequent follow-up visits to maximize her success with intensive lifestyle modifications for her multiple health conditions.   Objective:   Blood pressure 121/70, pulse 69, temperature 97.6 F (36.4 C), height 5\' 5"  (1.651 m), weight 181 lb (82.1 kg), SpO2 93 %. Body mass index is 30.12 kg/m.  General: Cooperative, alert, well developed, in no acute distress. HEENT: Conjunctivae and lids unremarkable. Cardiovascular: Regular rhythm.  Lungs: Normal work of breathing. Neurologic: No focal deficits.   Lab Results  Component Value Date   CREATININE 0.96 07/15/2020   BUN 16 07/15/2020   NA 142 07/15/2020   K 5.2 07/15/2020   CL 102 07/15/2020   CO2 23 07/15/2020   Lab Results  Component Value Date   ALT 15 07/15/2020   AST 16 07/15/2020   ALKPHOS 78 07/15/2020   BILITOT 0.3 07/15/2020   Lab Results  Component Value Date   HGBA1C 6.0 (H) 07/15/2020   HGBA1C 6.1 (H) 02/22/2020   Lab Results  Component Value Date   INSULIN 6.4 07/15/2020   INSULIN 12.2 02/22/2020   Lab Results  Component Value Date   TSH 0.974 02/22/2020   Lab Results  Component Value Date   CHOL 170 07/15/2020   HDL 66 07/15/2020   LDLCALC 81 07/15/2020   TRIG 135 07/15/2020   CHOLHDL 2.6 07/15/2020   Lab Results  Component Value Date   WBC 8.5 02/22/2020   HGB 13.5 02/22/2020   HCT 41.8 02/22/2020   MCV 92 02/22/2020   PLT 335 02/22/2020   Attestation Statements:   Reviewed by clinician on day of visit: allergies, medications, problem list, medical history, surgical history, family history, social history, and previous encounter  notes.  I, 02/24/2020, CMA, am acting as 02/24/2020 for Insurance claims handler, DO.  I have reviewed the above documentation for accuracy and completeness, and I agree with the above. Energy manager, D.O.  The 21st Century Cures Act was signed into law in 2016 which includes the topic of electronic health records.  This provides immediate access to information in MyChart.  This includes consultation notes, operative notes, office notes, lab results and pathology reports.  If you have any questions about what you read please let Carlye Grippe know at your next visit so we can discuss your concerns and take corrective action if need be.  We are right here with you.

## 2020-09-25 ENCOUNTER — Encounter: Payer: Self-pay | Admitting: Family Medicine

## 2020-09-25 ENCOUNTER — Other Ambulatory Visit: Payer: Self-pay

## 2020-09-25 ENCOUNTER — Ambulatory Visit (INDEPENDENT_AMBULATORY_CARE_PROVIDER_SITE_OTHER): Payer: Medicare Other | Admitting: Family Medicine

## 2020-09-25 VITALS — BP 125/78 | HR 72 | Ht 65.0 in | Wt 182.7 lb

## 2020-09-25 DIAGNOSIS — W57XXXA Bitten or stung by nonvenomous insect and other nonvenomous arthropods, initial encounter: Secondary | ICD-10-CM | POA: Diagnosis not present

## 2020-09-25 DIAGNOSIS — S40862A Insect bite (nonvenomous) of left upper arm, initial encounter: Secondary | ICD-10-CM | POA: Diagnosis not present

## 2020-09-25 MED ORDER — HYDROXYZINE HCL 25 MG PO TABS: 25.0000 mg | ORAL_TABLET | Freq: Three times a day (TID) | ORAL | 0 refills | Status: DC | PRN

## 2020-09-25 MED ORDER — TRIAMCINOLONE ACETONIDE 40 MG/ML IJ SUSP
40.0000 mg | Freq: Once | INTRAMUSCULAR | Status: AC
Start: 2020-09-25 — End: 2020-09-25
  Administered 2020-09-25: 40 mg via INTRAMUSCULAR

## 2020-09-25 MED ORDER — PREDNISONE 10 MG (21) PO TBPK: ORAL_TABLET | ORAL | 0 refills | Status: DC

## 2020-09-25 NOTE — Patient Instructions (Signed)
Start prednisone taper.  Use hydroxyzine as needed.  Avoid benadryl while taking this.    Insect Bite, Adult An insect bite can make your skin red, itchy, and swollen. Some insects can spread disease to people with a bite. However, most insect bites do not lead to disease, and most are not serious. What are the causes? Insects may bite for many reasons, including:  Hunger.  To defend themselves. Insects that bite include:  Spiders.  Mosquitoes.  Ticks.  Fleas.  Ants.  Flies.  Kissing bugs.  Chiggers. What are the signs or symptoms? Symptoms of this condition include:  Itching or pain in the bite area.  Redness and swelling in the bite area.  An open wound (skin ulcer). Symptoms often last for 2-4 days. In rare cases, a person may have a very bad allergic reaction (anaphylactic reaction) to a bite. Symptoms of an anaphylactic reaction may include:  Feeling warm in the face (flushed). Your face may turn red.  Itchy, red, swollen areas of skin (hives).  Swelling of the: ? Eyes. ? Lips. ? Face. ? Mouth. ? Tongue. ? Throat.  Trouble with any of these: ? Breathing. ? Talking. ? Swallowing.  Loud breathing (wheezing).  Feeling dizzy or light-headed.  Passing out (fainting).  Pain or cramps in your belly.  Throwing up (vomiting).  Watery poop (diarrhea). How is this treated? Treatment is usually not needed. Symptoms often go away on their own. When treatment is needed, it may involve:  Putting a cream or lotion on the bite area. This helps with itching.  Taking an antibiotic medicine. This treatment is needed if the bite area gets infected.  Getting a tetanus shot, if you are not up to date on this vaccine.  Putting ice on the affected area.  Using medicines called antihistamines. This treatment may be needed if you have itching or an allergic reaction to the insect bite.  Giving yourself a shot of medicine (epinephrine) using an auto-injector  "pen" if you have an anaphylactic reaction to a bite. Your doctor will teach you how to use this pen. Follow these instructions at home: Bite area care  Do not scratch the bite area.  Keep the bite area clean and dry.  Wash the bite area every day with soap and water as told by your doctor.  Check the bite area every day for signs of infection. Check for: ? Redness, swelling, or pain. ? Fluid or blood. ? Warmth. ? Pus or a bad smell.   Managing pain, itching, and swelling  You may put any of these on the bite area as told by your doctor: ? A paste made of baking soda and water. ? Cortisone cream. ? Calamine lotion.  If told, put ice on the bite area. ? Put ice in a plastic bag. ? Place a towel between your skin and the bag. ? Leave the ice on for 20 minutes, 2-3 times a day.   General instructions  Apply or take over-the-counter and prescription medicines only as told by your doctor.  If you were prescribed an antibiotic medicine, take or apply it as told by your doctor. Do not stop using the antibiotic even if your condition improves.  Keep all follow-up visits as told by your doctor. This is important. How is this prevented? To help you have a lower risk of insect bites:  When you are outside, wear clothing that covers your arms and legs.  Use insect repellent. The best insect repellents contain  one of these: ? DEET. ? Picaridin. ? Oil of lemon eucalyptus (OLE). ? IR3535.  Consider spraying your clothing with a pesticide called permethrin. Permethrin helps prevent insect bites. It works for several weeks and for up to 5-6 clothing washes. Do not apply permethrin directly to the skin.  If your home windows do not have screens, think about putting some in.  If you will be sleeping in an area where there are mosquitoes, consider covering your sleeping area with a mosquito net. Contact a doctor if:  You have redness, swelling, or pain in the bite area.  You have  fluid or blood coming from the bite area.  The bite area feels warm to the touch.  You have pus or a bad smell coming from the bite area.  You have a fever. Get help right away if:  You have joint pain.  You have a rash.  You feel more tired or sleepy than you normally do.  You have neck pain.  You have a headache.  You feel weaker than you normally do.  You have signs of an anaphylactic reaction. Signs may include: ? Feeling warm in the face. ? Itchy, red, swollen areas of skin. ? Swelling of your:  Eyes.  Lips.  Face.  Mouth.  Tongue.  Throat. ? Trouble with any of these:  Breathing.  Talking.  Swallowing. ? Loud breathing. ? Feeling dizzy or light-headed. ? Passing out. ? Pain or cramps in your belly. ? Throwing up. ? Watery poop. These symptoms may be an emergency. Do not wait to see if the symptoms will go away. Do this right away:  Use your auto-injector pen as you have been told.  Get medical help. Call your local emergency services (911 in the [place] Do not drive yourself to the hospital. Summary  An insect bite can make your skin red, itchy, and swollen.  Treatment is usually not needed. Symptoms often go away on their own.  Do not scratch the bite area. Keep it clean and dry.  Ice can help with pain and itching from the bite. This information is not intended to replace advice given to you by your health care provider. Make sure you discuss any questions you have with your health care provider. Document Revised: 03/12/2020 Document Reviewed: 10/29/2017 Elsevier Patient Education  2021 [employer].

## 2020-09-25 NOTE — Progress Notes (Signed)
Carolyn [name] - 65 y.o. female MRN [id number]  Date of birth: April 29, 1956  Subjective Chief Complaint  Patient presents with  . Insect Bite    HPI Carolyn [name] is a 65 y.o. female here today with complaint of insect bite.  She reports that she was working outside in her flower bets when she was bitten by several ants.  She has had hives around area that she has been bitten.  She denies significant pain.  She has tried benadryl and topical cortisone with minimal relief.  She denies respiratory symptoms.    ROS:  A comprehensive ROS was completed and negative except as noted per HPI  No Known Allergies  Past Medical History:  Diagnosis Date  . Anemia   . Anxiety   . Chest pain   . Chronic pain of both feet   . Depression   . Fibromyalgia   . Flat feet   . High cholesterol   . Joint pain   . Knee pain, bilateral   . Obesity   . Osteoarthritis    neck  . SOB (shortness of breath) on exertion   . Vitamin D deficiency     Past Surgical History:  Procedure Laterality Date  . TONSILLECTOMY  16  . TONSILLECTOMY    . TOTAL ABDOMINAL HYSTERECTOMY  10/90   has her left ovary    Social History   Socioeconomic History  . Marital status: Widowed    Spouse name: Not on file  . Number of children: Not on file  . Years of education: Not on file  . Highest education level: Not on file  Occupational History  . Occupation: Retired  Tobacco Use  . Smoking status: Never Smoker  . Smokeless tobacco: Never Used  Vaping Use  . Vaping Use: Never used  Substance and Sexual Activity  . Alcohol use: No  . Drug use: No  . Sexual activity: Not on file  Other Topics Concern  . Not on file  Social History Narrative  . Not on file   Social Determinants of Health   Financial Resource Strain: Not on file  Food Insecurity: Not on file  Transportation Needs: Not on file  Physical Activity: Not on file  Stress: Not on file  Social Connections: Not on file    Family History   Problem Relation Age of Onset  . Cancer Other        breast  . Diabetes Father   . Heart disease Father   . Obesity Father   . Heart attack Brother   . Breast cancer Mother 34  . Cancer Mother   . Lung cancer Brother     Health Maintenance  Topic Date Due  . Fecal DNA (Cologuard)  09/25/2018  . COVID-19 Vaccine (2 - Janssen risk 3-dose series) 08/25/2019  . TETANUS/TDAP  08/29/2019  . PNA vac Low Risk Adult (1 of 2 - PCV13) Never done  . INFLUENZA VACCINE  12/02/2020  . MAMMOGRAM  05/29/2022  . DEXA SCAN  Completed  . Hepatitis C Screening  Completed  . HIV Screening  Completed  . HPV VACCINES  Aged Out     ----------------------------------------------------------------------------------------------------------------------------------------------------------------------------------------------------------------- Physical Exam BP 125/78 (BP Location: Right Arm, Patient Position: Sitting, Cuff Size: Normal)   Pulse 72   Ht 5\' 5"  (1.651 m)   Wt 182 lb 11.2 oz (82.9 kg)   BMI 30.40 kg/m   Physical Exam Constitutional:      Appearance: Normal appearance.  Cardiovascular:  Rate and Rhythm: Normal rate.  Pulmonary:     Effort: Pulmonary effort is normal.     Breath sounds: Normal breath sounds.  Musculoskeletal:     Cervical back: Neck supple.  Skin:    Comments: Multiple urticarial lesions on arms bilaterally and additional lesion on bottom or r foot.   Neurological:     Mental Status: She is alert.     ------------------------------------------------------------------------------------------------------------------------------------------------------------------------------------------------------------------- Assessment and Plan  Insect bite of left arm Given injection of kenalog 40mg /ml Start prednisone taper Adding vistaril 25mg  daily.  Signs of infection discussed.  Return if symptoms are not improving.    Meds ordered this encounter  Medications   . predniSONE (STERAPRED UNI-PAK 21 TAB) 10 MG (21) TBPK tablet    Sig: Taper as directed on packaging    Dispense:  21 tablet    Refill:  0  . hydrOXYzine (ATARAX/VISTARIL) 25 MG tablet    Sig: Take 1 tablet (25 mg total) by mouth 3 (three) times daily as needed for itching.    Dispense:  30 tablet    Refill:  0  . triamcinolone acetonide (KENALOG-40) injection 40 mg    No follow-ups on file.    This visit occurred during the SARS-CoV-2 public health emergency.  Safety protocols were in place, including screening questions prior to the visit, additional usage of staff PPE, and extensive cleaning of exam room while observing appropriate contact time as indicated for disinfecting solutions.

## 2020-09-25 NOTE — Assessment & Plan Note (Signed)
Given injection of kenalog 40mg /ml Start prednisone taper Adding vistaril 25mg  daily.  Signs of infection discussed.  Return if symptoms are not improving.

## 2020-10-08 ENCOUNTER — Ambulatory Visit (INDEPENDENT_AMBULATORY_CARE_PROVIDER_SITE_OTHER): Payer: Medicare Other | Admitting: Family Medicine

## 2020-10-08 ENCOUNTER — Encounter (INDEPENDENT_AMBULATORY_CARE_PROVIDER_SITE_OTHER): Payer: Self-pay | Admitting: Family Medicine

## 2020-10-08 ENCOUNTER — Other Ambulatory Visit: Payer: Self-pay

## 2020-10-08 VITALS — BP 111/65 | HR 72 | Temp 98.4°F | Ht 65.0 in | Wt 177.0 lb

## 2020-10-08 DIAGNOSIS — Z6834 Body mass index (BMI) 34.0-34.9, adult: Secondary | ICD-10-CM | POA: Diagnosis not present

## 2020-10-08 DIAGNOSIS — E7849 Other hyperlipidemia: Secondary | ICD-10-CM

## 2020-10-08 DIAGNOSIS — E559 Vitamin D deficiency, unspecified: Secondary | ICD-10-CM | POA: Diagnosis not present

## 2020-10-08 DIAGNOSIS — E669 Obesity, unspecified: Secondary | ICD-10-CM | POA: Diagnosis not present

## 2020-10-08 DIAGNOSIS — Z9189 Other specified personal risk factors, not elsewhere classified: Secondary | ICD-10-CM

## 2020-10-08 MED ORDER — VITAMIN D (ERGOCALCIFEROL) 1.25 MG (50000 UNIT) PO CAPS: 50000.0000 [IU] | ORAL_CAPSULE | ORAL | 0 refills | Status: DC

## 2020-10-08 MED ORDER — ROSUVASTATIN CALCIUM 10 MG PO TABS: 10.0000 mg | ORAL_TABLET | Freq: Every day | ORAL | 0 refills | Status: DC

## 2020-10-16 NOTE — Progress Notes (Signed)
Chief Complaint:   OBESITY Carolyn [name] is here to discuss her progress with her obesity treatment plan along with follow-up of her obesity related diagnoses.   Today's visit was #: 12 Starting weight: 207 lbs Starting date: 02/22/2020 Today's weight: 177 lbs Today's date: 10/08/2020 Weight change since last visit: 4 lbs Total lbs lost to date: 30 lbs Body mass index is 29.45 kg/m.  Total weight loss percentage to date: -14.49%  Interim History:  Carolyn [name] says she has been very busy lately.  She is surprised she lost.  She has been doing more yard work lately, which she thinks is what helped her lose.  She is happy she broke 180 pounds, which is usually where she stalls.  Current Meal Plan: keeping a food journal and adhering to recommended goals of 1300-1400 calories and 90 grams of protein for 65% of the time.  Current Exercise Plan: Water aerobics, yard work for 60 minutes 3 times per week.  Assessment/Plan:   Medications Discontinued During This Encounter  Medication Reason   predniSONE (STERAPRED UNI-PAK 21 TAB) 10 MG (21) TBPK tablet    Vitamin D, Ergocalciferol, (DRISDOL) 1.25 MG (50000 UNIT) CAPS capsule Reorder   rosuvastatin (CRESTOR) 10 MG tablet Reorder   Meds ordered this encounter  Medications   Vitamin D, Ergocalciferol, (DRISDOL) 1.25 MG (50000 UNIT) CAPS capsule    Sig: Take 1 capsule (50,000 Units total) by mouth every 7 (seven) days.    Dispense:  4 capsule    Refill:  0   rosuvastatin (CRESTOR) 10 MG tablet    Sig: Take 1 tablet (10 mg total) by mouth at bedtime.    Dispense:  30 tablet    Refill:  0   1. Other hyperlipidemia Course: Not at goal. Lipid-lowering medications: Crestor 10 mg daily.   Plan: Dietary changes: Increase soluble fiber, decrease simple carbohydrates, decrease saturated fat. Exercise changes: Moderate to vigorous-intensity aerobic activity 150 minutes per week or as tolerated. We will continue to monitor along with PCP/specialists as it  pertains to her weight loss journey.  Lab Results  Component Value Date   CHOL 170 07/15/2020   HDL 66 07/15/2020   LDLCALC 81 07/15/2020   TRIG 135 07/15/2020   CHOLHDL 2.6 07/15/2020   Lab Results  Component Value Date   ALT 15 07/15/2020   AST 16 07/15/2020   ALKPHOS 78 07/15/2020   BILITOT 0.3 07/15/2020   The 10-year ASCVD risk score Denman George DC Jr., et al., 2013) is: 3.6%   Values used to calculate the score:     Age: 65 years     Sex: Female     Is Non-Hispanic African American: No     Diabetic: No     Tobacco smoker: No     Systolic Blood Pressure: 111 mmHg     Is BP treated: No     HDL Cholesterol: 66 mg/dL     Total Cholesterol: 170 mg/dL  - Refill rosuvastatin (CRESTOR) 10 MG tablet; Take 1 tablet (10 mg total) by mouth at bedtime.  Dispense: 30 tablet; Refill: 0  2. Vitamin D deficiency At goal. Current vitamin D is 52.6, tested on 07/15/2020. Optimal goal > 50 ng/dL.  She is taking vitamin D 50,000 IU weekly.  Plan: Continue to take prescription Vitamin D @50 ,000 IU every week as prescribed.  Follow-up for routine testing of Vitamin D, at least 2-3 times per year to avoid over-replacement.  - Refill Vitamin D, Ergocalciferol, (DRISDOL) 1.25 MG (  50000 UNIT) CAPS capsule; Take 1 capsule (50,000 Units total) by mouth every 7 (seven) days.  Dispense: 4 capsule; Refill: 0  3. Obesity, current BMI 29.6  Course: Carolyn [name] is currently in the action stage of change. As such, her goal is to continue with weight loss efforts.   Nutrition goals: She has agreed to keeping a food journal and adhering to recommended goals of 1300-1400 calories and 90 grams of protein.   Exercise goals:  As is.  Behavioral modification strategies: meal planning and cooking strategies (especially when only cooking for herself) and keeping a strict food journal.  Carolyn [name] has agreed to follow-up with our clinic in 3-4 weeks. She was informed of the importance of frequent follow-up visits to maximize  her success with intensive lifestyle modifications for her multiple health conditions.   Objective:   Blood pressure 111/65, pulse 72, temperature 98.4 F (36.9 C), height 5\' 5"  (1.651 m), weight 177 lb (80.3 kg), SpO2 97 %. Body mass index is 29.45 kg/m.  General: Cooperative, alert, well developed, in no acute distress. HEENT: Conjunctivae and lids unremarkable. Cardiovascular: Regular rhythm.  Lungs: Normal work of breathing. Neurologic: No focal deficits.   Lab Results  Component Value Date   CREATININE 0.96 07/15/2020   BUN 16 07/15/2020   NA 142 07/15/2020   K 5.2 07/15/2020   CL 102 07/15/2020   CO2 23 07/15/2020   Lab Results  Component Value Date   ALT 15 07/15/2020   AST 16 07/15/2020   ALKPHOS 78 07/15/2020   BILITOT 0.3 07/15/2020   Lab Results  Component Value Date   HGBA1C 6.0 (H) 07/15/2020   HGBA1C 6.1 (H) 02/22/2020   Lab Results  Component Value Date   INSULIN 6.4 07/15/2020   INSULIN 12.2 02/22/2020   Lab Results  Component Value Date   TSH 0.974 02/22/2020   Lab Results  Component Value Date   CHOL 170 07/15/2020   HDL 66 07/15/2020   LDLCALC 81 07/15/2020   TRIG 135 07/15/2020   CHOLHDL 2.6 07/15/2020   Lab Results  Component Value Date   WBC 8.5 02/22/2020   HGB 13.5 02/22/2020   HCT 41.8 02/22/2020   MCV 92 02/22/2020   PLT 335 02/22/2020   Obesity Behavioral Intervention:   Approximately 15 minutes were spent on the discussion below.  ASK: We discussed the diagnosis of obesity with Carolyn [name] today and Carolyn [name] agreed to give 02/24/2020 permission to discuss obesity behavioral modification therapy today.  ASSESS: Carolyn [name] has the diagnosis of obesity and her BMI today is 29.6. Carolyn [name] is in the action stage of change.   ADVISE: Carolyn [name] was educated on the multiple health risks of obesity as well as the benefit of weight loss to improve her health. She was advised of the need for long term treatment and the importance of lifestyle modifications  to improve her current health and to decrease her risk of future health problems.  AGREE: Multiple dietary modification options and treatment options were discussed and Carolyn [name] agreed to follow the recommendations documented in the above note.  ARRANGE: Carolyn [name] was educated on the importance of frequent visits to treat obesity as outlined per CMS and USPSTF guidelines and agreed to schedule her next follow up appointment today.  Attestation Statements:   Reviewed by clinician on day of visit: allergies, medications, problem list, medical history, surgical history, family history, social history, and previous encounter notes.  I, Sedalia Muta, CMA, am acting as Insurance claims handler for Energy manager, DO.  I  have reviewed the above documentation for accuracy and completeness, and I agree with the above. Marjory Sneddon, D.O.  The [place] was signed into law in 2016 which includes the topic of electronic health records.  This provides immediate access to information in MyChart.  This includes consultation notes, operative notes, office notes, lab results and pathology reports.  If you have any questions about what you read please let us know at your next visit so we can discuss your concerns and take corrective action if need be.  We are right here with you.

## 2020-11-05 ENCOUNTER — Other Ambulatory Visit: Payer: Self-pay

## 2020-11-05 ENCOUNTER — Ambulatory Visit (INDEPENDENT_AMBULATORY_CARE_PROVIDER_SITE_OTHER): Payer: Medicare Other | Admitting: Family Medicine

## 2020-11-05 ENCOUNTER — Encounter (INDEPENDENT_AMBULATORY_CARE_PROVIDER_SITE_OTHER): Payer: Self-pay | Admitting: Family Medicine

## 2020-11-05 VITALS — BP 111/61 | HR 67 | Temp 98.6°F | Ht 65.0 in | Wt 176.0 lb

## 2020-11-05 DIAGNOSIS — Z6831 Body mass index (BMI) 31.0-31.9, adult: Secondary | ICD-10-CM

## 2020-11-05 DIAGNOSIS — E7849 Other hyperlipidemia: Secondary | ICD-10-CM

## 2020-11-05 DIAGNOSIS — E559 Vitamin D deficiency, unspecified: Secondary | ICD-10-CM | POA: Diagnosis not present

## 2020-11-05 DIAGNOSIS — E669 Obesity, unspecified: Secondary | ICD-10-CM

## 2020-11-05 MED ORDER — ROSUVASTATIN CALCIUM 10 MG PO TABS: 10.0000 mg | ORAL_TABLET | Freq: Every day | ORAL | 0 refills | Status: DC

## 2020-11-05 MED ORDER — VITAMIN D (ERGOCALCIFEROL) 1.25 MG (50000 UNIT) PO CAPS
50000.0000 [IU] | ORAL_CAPSULE | ORAL | 0 refills | Status: DC
Start: 2020-11-05 — End: 2020-12-16

## 2020-11-14 NOTE — Progress Notes (Signed)
Chief Complaint:   OBESITY Lania is here to discuss her progress with her obesity treatment plan along with follow-up of her obesity related diagnoses.   Today's visit was #: 13 Starting weight: 207 lbs Starting date: 02/22/2020 Today's weight: 176 lbs Today's date: 11/05/2020 Weight change since last visit: 1 lb Total lbs lost to date: 31 lbs Body mass index is 29.29 kg/m.  Total weight loss percentage to date: -14.98%  Interim History:  Jersee has not been as diligent with journaling recently.  No issues with meal plan.  She is glad she lost 1 pound today.  She recognizes that when she follows the plan, she has no hunger or cravings versus when she is not following it.  Plan:  Bring in a separate notebook/ paper with date, total calories, and grams of protein for each day.  Current Meal Plan: keeping a food journal and adhering to recommended goals of 1300-1400 calories and 90 grams of protein for 80% of the time.  Current Exercise Plan: Water aerobics for 60 minutes 2 times per week.  Assessment/Plan:   Medications Discontinued During This Encounter  Medication Reason   Vitamin D, Ergocalciferol, (DRISDOL) 1.25 MG (50000 UNIT) CAPS capsule Reorder   rosuvastatin (CRESTOR) 10 MG tablet Reorder   Meds ordered this encounter  Medications   rosuvastatin (CRESTOR) 10 MG tablet    Sig: Take 1 tablet (10 mg total) by mouth at bedtime.    Dispense:  30 tablet    Refill:  0   Vitamin D, Ergocalciferol, (DRISDOL) 1.25 MG (50000 UNIT) CAPS capsule    Sig: Take 1 capsule (50,000 Units total) by mouth every 7 (seven) days.    Dispense:  4 capsule    Refill:  0   1. Other hyperlipidemia Course: At goal. Lipid-lowering medications: Crestor 10 mg daily.  Tol well w/o issue.   Plan: Dietary changes: Increase soluble fiber, decrease simple carbohydrates, decrease saturated fat. Exercise changes: Moderate to vigorous-intensity aerobic activity 150 minutes per week or as tolerated. We  will continue to monitor along with PCP/specialists as it pertains to her weight loss journey.  Lab Results  Component Value Date   CHOL 170 07/15/2020   HDL 66 07/15/2020   LDLCALC 81 07/15/2020   TRIG 135 07/15/2020   CHOLHDL 2.6 07/15/2020   Lab Results  Component Value Date   ALT 15 07/15/2020   AST 16 07/15/2020   ALKPHOS 78 07/15/2020   BILITOT 0.3 07/15/2020   The 10-year ASCVD risk score Denman George DC Jr., et al., 2013) is: 3.6%   Values used to calculate the score:     Age: 81 years     Sex: Female     Is Non-Hispanic African American: No     Diabetic: No     Tobacco smoker: No     Systolic Blood Pressure: 111 mmHg     Is BP treated: No     HDL Cholesterol: 66 mg/dL     Total Cholesterol: 170 mg/dL  - Refill rosuvastatin (CRESTOR) 10 MG tablet; Take 1 tablet (10 mg total) by mouth at bedtime.  Dispense: 30 tablet; Refill: 0  2. Vitamin D deficiency At goal.  She is taking vitamin D 50,000 IU weekly.  Plan: Continue to take prescription Vitamin D @50 ,000 IU every week as prescribed.  Follow-up for routine testing of Vitamin D, at least 2-3 times per year to avoid over-replacement.  Lab Results  Component Value Date   VD25OH 52.6 07/15/2020  VD25OH 23.9 (L) 02/22/2020   VD25OH 35 03/14/2014   - Refill Vitamin D, Ergocalciferol, (DRISDOL) 1.25 MG (50000 UNIT) CAPS capsule; Take 1 capsule (50,000 Units total) by mouth every 7 (seven) days.  Dispense: 4 capsule; Refill: 0  3. Class 1 obesity with serious comorbidity and body mass index (BMI) of 31.0 to 31.9 in adult, unspecified obesity type  Course: Toniyah is currently in the action stage of change. As such, her goal is to continue with weight loss efforts.   Nutrition goals: She has agreed to keeping a food journal and adhering to recommended goals of 1300-1400 calories and 90 grams of protein.   Exercise goals:  As is.  Behavioral modification strategies: emotional eating strategies and keeping a strict food  journal.  Meloney has agreed to follow-up with our clinic in 3 weeks. She was informed of the importance of frequent follow-up visits to maximize her success with intensive lifestyle modifications for her multiple health conditions.   Objective:   Blood pressure 111/61, pulse 67, temperature 98.6 F (37 C), height 5\' 5"  (1.651 m), weight 176 lb (79.8 kg), SpO2 96 %. Body mass index is 29.29 kg/m.  General: Cooperative, alert, well developed, in no acute distress. HEENT: Conjunctivae and lids unremarkable. Cardiovascular: Regular rhythm.  Lungs: Normal work of breathing. Neurologic: No focal deficits.   Lab Results  Component Value Date   CREATININE 0.96 07/15/2020   BUN 16 07/15/2020   NA 142 07/15/2020   K 5.2 07/15/2020   CL 102 07/15/2020   CO2 23 07/15/2020   Lab Results  Component Value Date   ALT 15 07/15/2020   AST 16 07/15/2020   ALKPHOS 78 07/15/2020   BILITOT 0.3 07/15/2020   Lab Results  Component Value Date   HGBA1C 6.0 (H) 07/15/2020   HGBA1C 6.1 (H) 02/22/2020   Lab Results  Component Value Date   INSULIN 6.4 07/15/2020   INSULIN 12.2 02/22/2020   Lab Results  Component Value Date   TSH 0.974 02/22/2020   Lab Results  Component Value Date   CHOL 170 07/15/2020   HDL 66 07/15/2020   LDLCALC 81 07/15/2020   TRIG 135 07/15/2020   CHOLHDL 2.6 07/15/2020   Lab Results  Component Value Date   VD25OH 52.6 07/15/2020   VD25OH 23.9 (L) 02/22/2020   VD25OH 35 03/14/2014   Lab Results  Component Value Date   WBC 8.5 02/22/2020   HGB 13.5 02/22/2020   HCT 41.8 02/22/2020   MCV 92 02/22/2020   PLT 335 02/22/2020   Obesity Behavioral Intervention:   Approximately 15 minutes were spent on the discussion below.  ASK: We discussed the diagnosis of obesity with Mannie today and Tiarna agreed to give 02/24/2020 permission to discuss obesity behavioral modification therapy today.  ASSESS: Koralee has the diagnosis of obesity and her BMI today is 29.4. Avory is  in the action stage of change.   ADVISE: Aparna was educated on the multiple health risks of obesity as well as the benefit of weight loss to improve her health. She was advised of the need for long term treatment and the importance of lifestyle modifications to improve her current health and to decrease her risk of future health problems.  AGREE: Multiple dietary modification options and treatment options were discussed and Yvanna agreed to follow the recommendations documented in the above note.  ARRANGE: Zoella was educated on the importance of frequent visits to treat obesity as outlined per CMS and USPSTF guidelines and agreed  to schedule her next follow up appointment today.  Attestation Statements:   Reviewed by clinician on day of visit: allergies, medications, problem list, medical history, surgical history, family history, social history, and previous encounter notes.  I, Water quality scientist, CMA, am acting as Location manager for [employer], DO.  I have reviewed the above documentation for accuracy and completeness, and I agree with the above. Marjory Sneddon, D.O.  The [place] was signed into law in 2016 which includes the topic of electronic health records.  This provides immediate access to information in MyChart.  This includes consultation notes, operative notes, office notes, lab results and pathology reports.  If you have any questions about what you read please let us know at your next visit so we can discuss your concerns and take corrective action if need be.  We are right here with you.

## 2020-11-21 ENCOUNTER — Other Ambulatory Visit: Payer: Self-pay | Admitting: Family Medicine

## 2020-11-21 DIAGNOSIS — M797 Fibromyalgia: Secondary | ICD-10-CM

## 2020-11-26 ENCOUNTER — Ambulatory Visit (INDEPENDENT_AMBULATORY_CARE_PROVIDER_SITE_OTHER): Payer: Medicare Other | Admitting: Family Medicine

## 2020-12-16 ENCOUNTER — Ambulatory Visit (INDEPENDENT_AMBULATORY_CARE_PROVIDER_SITE_OTHER): Payer: Medicare Other | Admitting: Family Medicine

## 2020-12-16 ENCOUNTER — Other Ambulatory Visit: Payer: Self-pay

## 2020-12-16 ENCOUNTER — Encounter (INDEPENDENT_AMBULATORY_CARE_PROVIDER_SITE_OTHER): Payer: Self-pay | Admitting: Family Medicine

## 2020-12-16 VITALS — BP 115/74 | HR 72 | Temp 97.9°F | Ht 65.0 in | Wt 176.0 lb

## 2020-12-16 DIAGNOSIS — Z9189 Other specified personal risk factors, not elsewhere classified: Secondary | ICD-10-CM | POA: Diagnosis not present

## 2020-12-16 DIAGNOSIS — Z6834 Body mass index (BMI) 34.0-34.9, adult: Secondary | ICD-10-CM | POA: Diagnosis not present

## 2020-12-16 DIAGNOSIS — E559 Vitamin D deficiency, unspecified: Secondary | ICD-10-CM

## 2020-12-16 DIAGNOSIS — E669 Obesity, unspecified: Secondary | ICD-10-CM

## 2020-12-16 DIAGNOSIS — E7849 Other hyperlipidemia: Secondary | ICD-10-CM | POA: Diagnosis not present

## 2020-12-16 MED ORDER — VITAMIN D (ERGOCALCIFEROL) 1.25 MG (50000 UNIT) PO CAPS: 50000.0000 [IU] | ORAL_CAPSULE | ORAL | 0 refills | Status: DC

## 2020-12-16 MED ORDER — ROSUVASTATIN CALCIUM 10 MG PO TABS: 10.0000 mg | ORAL_TABLET | Freq: Every day | ORAL | 0 refills | Status: DC

## 2020-12-17 NOTE — Progress Notes (Signed)
Chief Complaint:   OBESITY Carolyn [name] is here to discuss her progress with her obesity treatment plan along with follow-up of her obesity related diagnoses. Carolyn [name] is on keeping a food journal and adhering to recommended goals of 1300-1400 calories and 90 grams protein and states she is following her eating plan approximately 50% of the time. Carolyn [name] states she is doing water aerobics 60 minutes 3 times per week.  Today's visit was #: 14 Starting weight: 207 lbs Starting date: 02/22/2020 Today's weight: 176 lbs Today's date: 12/16/2020 Total lbs lost to date: 31 Total lbs lost since last in-office visit: 0  Interim History: Carolyn [name] did a lot for her church over the last several weeks and was not eating on plan much. She typically gets 60-70 grams protein and 1300-1400 calories per day.  Subjective:   1. Vitamin D deficiency At goal.   Lab Results  Component Value Date   VD25OH 52.6 07/15/2020   VD25OH 23.9 (L) 02/22/2020   VD25OH 35 03/14/2014   2. Other hyperlipidemia Course: At goal. Lipid-lowering medications: Crestor.   Lab Results  Component Value Date   CHOL 170 07/15/2020   HDL 66 07/15/2020   LDLCALC 81 07/15/2020   TRIG 135 07/15/2020   CHOLHDL 2.6 07/15/2020   Lab Results  Component Value Date   ALT 15 07/15/2020   AST 16 07/15/2020   ALKPHOS 78 07/15/2020   BILITOT 0.3 07/15/2020   The 10-year ASCVD risk score Denman George DC Jr., et al., 2013) is: 3.9%   Values used to calculate the score:     Age: 65 years     Sex: Female     Is Non-Hispanic African American: No     Diabetic: No     Tobacco smoker: No     Systolic Blood Pressure: 115 mmHg     Is BP treated: No     HDL Cholesterol: 66 mg/dL     Total Cholesterol: 170 mg/dL  3. At risk for deficient intake of food Carolyn [name] is at risk for deficient intake of food due to skipping meals.  Assessment/Plan:  No orders of the defined types were placed in this encounter.   Medications Discontinued During This  Encounter  Medication Reason   rosuvastatin (CRESTOR) 10 MG tablet Reorder   Vitamin D, Ergocalciferol, (DRISDOL) 1.25 MG (50000 UNIT) CAPS capsule Reorder     Meds ordered this encounter  Medications   rosuvastatin (CRESTOR) 10 MG tablet    Sig: Take 1 tablet (10 mg total) by mouth at bedtime.    Dispense:  30 tablet    Refill:  0   Vitamin D, Ergocalciferol, (DRISDOL) 1.25 MG (50000 UNIT) CAPS capsule    Sig: Take 1 capsule (50,000 Units total) by mouth every 7 (seven) days.    Dispense:  4 capsule    Refill:  0     1. Vitamin D deficiency Continue to take prescription Vitamin D @50 ,000 IU every week as prescribed.  Follow-up for routine testing of Vitamin D, at least 2-3 times per year to avoid over-replacement.  Refill- Vitamin D, Ergocalciferol, (DRISDOL) 1.25 MG (50000 UNIT) CAPS capsule; Take 1 capsule (50,000 Units total) by mouth every 7 (seven) days.  Dispense: 4 capsule; Refill: 0  2. Other hyperlipidemia Plan: Dietary changes: Increase soluble fiber, decrease simple carbohydrates, decrease saturated fat. Exercise changes: Moderate to vigorous-intensity aerobic activity 150 minutes per week or as tolerated. We will continue to monitor along with PCP/specialists as it pertains to her  weight loss journey.  Refill- rosuvastatin (CRESTOR) 10 MG tablet; Take 1 tablet (10 mg total) by mouth at bedtime.  Dispense: 30 tablet; Refill: 0  3. At risk for deficient intake of food Carolyn [name] was given extensive education and counseling today of more than 9 minutes on risks associated with deficient food intake.  Counseled her on the importance of following our prescribed meal plan and eating adequate amounts of protein.  Discussed with Carolyn [name] that inadequate food intake over longer periods of time can slow their metabolism down significantly.   4. Obesity with current BMI of 29.4  Carolyn [name] is currently in the action stage of change. As such, her goal is to continue with weight loss  efforts. She has agreed to keeping a food journal and adhering to recommended goals of 1300-1400 calories and 90 grams protein.   Exercise goals:  As is  Behavioral modification strategies: meal planning and cooking strategies and planning for success.  Carolyn [name] has agreed to follow-up with our clinic in 4 weeks. She was informed of the importance of frequent follow-up visits to maximize her success with intensive lifestyle modifications for her multiple health conditions.   Objective:   Blood pressure 115/74, pulse 72, temperature 97.9 F (36.6 C), height 5\' 5"  (1.651 m), weight 176 lb (79.8 kg), SpO2 98 %. Body mass index is 29.29 kg/m.  General: Cooperative, alert, well developed, in no acute distress. HEENT: Conjunctivae and lids unremarkable. Cardiovascular: Regular rhythm.  Lungs: Normal work of breathing. Neurologic: No focal deficits.   Lab Results  Component Value Date   CREATININE 0.96 07/15/2020   BUN 16 07/15/2020   NA 142 07/15/2020   K 5.2 07/15/2020   CL 102 07/15/2020   CO2 23 07/15/2020   Lab Results  Component Value Date   ALT 15 07/15/2020   AST 16 07/15/2020   ALKPHOS 78 07/15/2020   BILITOT 0.3 07/15/2020   Lab Results  Component Value Date   HGBA1C 6.0 (H) 07/15/2020   HGBA1C 6.1 (H) 02/22/2020   Lab Results  Component Value Date   INSULIN 6.4 07/15/2020   INSULIN 12.2 02/22/2020   Lab Results  Component Value Date   TSH 0.974 02/22/2020   Lab Results  Component Value Date   CHOL 170 07/15/2020   HDL 66 07/15/2020   LDLCALC 81 07/15/2020   TRIG 135 07/15/2020   CHOLHDL 2.6 07/15/2020   Lab Results  Component Value Date   VD25OH 52.6 07/15/2020   VD25OH 23.9 (L) 02/22/2020   VD25OH 35 03/14/2014   Lab Results  Component Value Date   WBC 8.5 02/22/2020   HGB 13.5 02/22/2020   HCT 41.8 02/22/2020   MCV 92 02/22/2020   PLT 335 02/22/2020    Attestation Statements:   Reviewed by clinician on day of visit: allergies,  medications, problem list, medical history, surgical history, family history, social history, and previous encounter notes.  02/24/2020, CMA, am acting as transcriptionist for Edmund Hilda, DO.  I have reviewed the above documentation for accuracy and completeness, and I agree with the above. [employer], D.O.  The 21st Century Cures Act was signed into law in 2016 which includes the topic of electronic health records.  This provides immediate access to information in MyChart.  This includes consultation notes, operative notes, office notes, lab results and pathology reports.  If you have any questions about what you read please let 2017 know at your next visit so we can discuss  your concerns and take corrective action if need be.  We are right here with you.

## 2020-12-30 ENCOUNTER — Ambulatory Visit: Payer: Medicare Other | Admitting: Family Medicine

## 2020-12-30 ENCOUNTER — Other Ambulatory Visit: Payer: Self-pay

## 2020-12-30 ENCOUNTER — Encounter: Payer: Self-pay | Admitting: Family Medicine

## 2020-12-30 ENCOUNTER — Ambulatory Visit (INDEPENDENT_AMBULATORY_CARE_PROVIDER_SITE_OTHER): Payer: Medicare Other | Admitting: Family Medicine

## 2020-12-30 VITALS — BP 135/70 | HR 91 | Ht 65.0 in | Wt 182.0 lb

## 2020-12-30 DIAGNOSIS — Z1211 Encounter for screening for malignant neoplasm of colon: Secondary | ICD-10-CM

## 2020-12-30 DIAGNOSIS — Z23 Encounter for immunization: Secondary | ICD-10-CM | POA: Diagnosis not present

## 2020-12-30 DIAGNOSIS — Z0000 Encounter for general adult medical examination without abnormal findings: Secondary | ICD-10-CM

## 2020-12-30 MED ORDER — PREDNISONE 10 MG (21) PO TBPK: ORAL_TABLET | ORAL | 0 refills | Status: DC

## 2020-12-30 MED ORDER — HYDROXYZINE HCL 25 MG PO TABS: 25.0000 mg | ORAL_TABLET | Freq: Three times a day (TID) | ORAL | 0 refills | Status: DC | PRN

## 2020-12-30 NOTE — Progress Notes (Signed)
Subjective:   Carolyn [name] is a 65 y.o. female who presents for an Initial Medicare Annual Wellness Visit.  Review of Systems    Neg ROS        Objective:    Today's Vitals   12/30/20 1423 12/30/20 1549  BP: 140/73 135/70  Pulse: 91   SpO2: 97%   Weight: 182 lb (82.6 kg)   Height: 5\' 5"  (1.651 m)    Body mass index is 30.29 kg/m.  No flowsheet data found.  Current Medications (verified) Outpatient Encounter Medications as of 12/30/2020  Medication Sig   Ascorbic Acid (VITAMIN C PO) Take 1,000 mg by mouth daily.   Cholecalciferol (VITAMIN D-3 PO) Take 5,000 Units by mouth daily.   cyclobenzaprine (FLEXERIL) 5 MG tablet TAKE 1 OR 2 TABLETS BY MOUTH DAILY AT BEDTIME AS NEEDED FOR MUSCLE SPASMS   cycloSPORINE (RESTASIS) 0.05 % ophthalmic emulsion 1 drop 2 (two) times daily.   Multiple Vitamins-Minerals (ZINC PO) Take by mouth. 22 mg/800 mg daily   rosuvastatin (CRESTOR) 10 MG tablet Take 1 tablet (10 mg total) by mouth at bedtime.   sertraline (ZOLOFT) 25 MG tablet Take 1 tablet (25 mg total) by mouth daily.   Vitamin D, Ergocalciferol, (DRISDOL) 1.25 MG (50000 UNIT) CAPS capsule Take 1 capsule (50,000 Units total) by mouth every 7 (seven) days.   [DISCONTINUED] hydrOXYzine (ATARAX/VISTARIL) 25 MG tablet Take 1 tablet (25 mg total) by mouth 3 (three) times daily as needed for itching.   hydrOXYzine (ATARAX/VISTARIL) 25 MG tablet Take 1 tablet (25 mg total) by mouth 3 (three) times daily as needed for itching.   predniSONE (STERAPRED UNI-PAK 21 TAB) 10 MG (21) TBPK tablet Taper as directed on packaging   [DISCONTINUED] sertraline (ZOLOFT) 100 MG tablet Take 1 tablet (100 mg total) by mouth daily. (Patient taking differently: Take 50 mg by mouth daily. 1/2 tablet qd)   No facility-administered encounter medications on file as of 12/30/2020.    Allergies (verified) Patient has no known allergies.   History: Past Medical History:  Diagnosis Date   Anemia    Anxiety     Chest pain    Chronic pain of both feet    Depression    Fibromyalgia    Flat feet    High cholesterol    Joint pain    Knee pain, bilateral    Obesity    Osteoarthritis    neck   SOB (shortness of breath) on exertion    Vitamin D deficiency    Past Surgical History:  Procedure Laterality Date   TONSILLECTOMY  15   TONSILLECTOMY     TOTAL ABDOMINAL HYSTERECTOMY  10/90   has her left ovary   Family History  Problem Relation Age of Onset   Cancer Other        breast   Diabetes Father    Heart disease Father    Obesity Father    Heart attack Brother    Breast cancer Mother [id number]   Cancer Mother    Lung cancer Brother    Social History   Socioeconomic History   Marital status: Widowed    Spouse name: Not on file   Number of children: Not on file   Years of education: Not on file   Highest education level: Not on file  Occupational History   Occupation: Retired  Tobacco Use   Smoking status: Never   Smokeless tobacco: Never  Vaping Use   Vaping Use: Never used  Substance  and Sexual Activity   Alcohol use: No   Drug use: No   Sexual activity: Not on file  Other Topics Concern   Not on file  Social History Narrative   Not on file   Social Determinants of Health   Financial Resource Strain: Not on file  Food Insecurity: Not on file  Transportation Needs: Not on file  Physical Activity: Not on file  Stress: Not on file  Social Connections: Not on file    Tobacco Counseling Counseling given: Not Answered   Clinical Intake:  Pre-visit preparation completed: Yes        BMI - recorded: 30 Nutritional Status: BMI > 30  Obese Diabetes: No  Diabetic?No  Activities of Daily Living In your present state of health, do you have any difficulty performing the following activities: 12/30/2020 12/30/2020  Hearing? N N  Vision? N N  Difficulty concentrating or making decisions? N N  Walking or climbing stairs? N N  Dressing or bathing? N N  Doing errands,  shopping? N N  Preparing Food and eating ? N -  Using the Toilet? N -  In the past six months, have you accidently leaked urine? N -  Do you have problems with loss of bowel control? N -  Managing your Medications? N -  Managing your Finances? N -  Housekeeping or managing your Housekeeping? N -  Some recent data might be hidden    Patient Care Team: Agapito Games, MD as PCP - General  Indicate any recent Medical Services you may have received from other than [facility] providers in the past year (date may be approximate).     Assessment:   This is a routine wellness examination for Carolyn [name].  Hearing/Vision screen No results found.  Dietary issues and exercise activities discussed: Current Exercise Habits: Structured exercise class, Type of exercise: Other - see comments (water aerobics), Time (Minutes): 30, Frequency (Times/Week): 3, Weekly Exercise (Minutes/Week): 90, Intensity: Moderate, Exercise limited by: None identified   Goals Addressed   None    Depression Screen PHQ 2/9 Scores 12/30/2020 07/01/2020 04/01/2020 04/01/2020 02/22/2020 05/03/2019 05/03/2019  PHQ - 2 Score 0 0 1 1 4 1 1   PHQ- 9 Score - 1 4 - 14 9 -    Fall Risk Fall Risk  12/30/2020 07/01/2020 04/01/2020 03/03/2019  Falls in the past year? 1 0 1 0  Number falls in past yr: 0 - 0 0  Injury with Fall? 0 - 0 0  Risk for fall due to : - No Fall Risks History of fall(s) -  Follow up Falls evaluation completed - Falls evaluation completed -    FALL RISK PREVENTION PERTAINING TO THE HOME:  If so, are there any without handrails? Yes  Home free of loose throw rugs in walkways, pet beds, electrical cords, etc? Yes  Adequate lighting in your home to reduce risk of falls? Yes   ASSISTIVE DEVICES UTILIZED TO PREVENT FALLS:  Life alert? Yes  Use of a cane, walker or w/c? No  Grab bars in the bathroom? Yes  Shower chair or bench in shower? Yes  Elevated toilet seat or a handicapped toilet? No   TIMED UP  AND GO:  Was the test performed? Yes .  Length of time to ambulate 10 feet: 11 sec.   Gait steady and fast without use of assistive device  Cognitive Function:     6CIT Screen 12/30/2020  What Year? 0 points  What month? 0 points  What time? 0 points  Count back from 20 0 points  Months in reverse 0 points  Repeat phrase 0 points  Total Score 0    Immunizations Immunization History  Administered Date(s) Administered   Fluad Quad(high Dose 65+) 04/01/2020, 12/30/2020   Influenza Split 03/11/2011   Influenza,inj,Quad PF,6+ Mos 02/03/2013, 03/14/2014, 01/18/2015, 01/30/2016, 01/29/2017, 01/31/2018, 03/03/2019   Janssen (J&J) SARS-COV-2 Vaccination 07/28/2019   PNEUMOCOCCAL CONJUGATE-20 12/30/2020   Td 08/28/2009    TDAP status: Due, Education has been provided regarding the importance of this vaccine. Advised may receive this vaccine at local pharmacy or Health Dept. Aware to provide a copy of the vaccination record if obtained from local pharmacy or Health Dept. Verbalized acceptance and understanding.  Flu Vaccine status: Completed at today's visit  Pneumococcal vaccine status: Completed during today's visit.  Covid-19 vaccine status: Completed vaccines  Qualifies for Shingles Vaccine? Yes   Zostavax completed No   Shingrix Completed?: No.    Education has been provided regarding the importance of this vaccine. Patient has been advised to call insurance company to determine out of pocket expense if they have not yet received this vaccine. Advised may also receive vaccine at local pharmacy or Health Dept. Verbalized acceptance and understanding.  Screening Tests Health Maintenance  Topic Date Due   Zoster Vaccines- Shingrix (1 of 2) Never done   Fecal DNA (Cologuard)  09/25/2018   COVID-19 Vaccine (2 - Janssen risk series) 08/25/2019   TETANUS/TDAP  08/29/2019   PNA vac Low Risk Adult (1 of 2 - PCV13) 03/16/2020   MAMMOGRAM  05/29/2022   INFLUENZA VACCINE  Completed    DEXA SCAN  Completed   Hepatitis C Screening  Completed   HIV Screening  Completed   HPV VACCINES  Aged Out    Health Maintenance  Health Maintenance Due  Topic Date Due   Zoster Vaccines- Shingrix (1 of 2) Never done   Fecal DNA (Cologuard)  09/25/2018   COVID-19 Vaccine (2 - Janssen risk series) 08/25/2019   TETANUS/TDAP  08/29/2019   PNA vac Low Risk Adult (1 of 2 - PCV13) 03/16/2020    Colorectal cancer screening: Type of screening: Cologuard. Completed ordered. Repeat every 3 years  Mammogram status: Completed 05/29/2020. Repeat every year  Bone Density status: Completed 05/29/2020. Results reflect: Bone density results: OSTEOPENIA. Repeat every 3-4 years.  Lung Cancer Screening: (Low Dose CT Chest recommended if Age 73-80 years, 30 pack-year currently smoking OR have quit w/in 15years.) does not qualify.   Lung Cancer Screening Referral: NA  Additional Screening:  Hepatitis C Screening: does not qualify; Completed Yes  Vision Screening: Recommended annual ophthalmology exams for early detection of glaucoma and other disorders of the eye. Is the patient up to date with their annual eye exam?  Yes    Dental Screening: Recommended annual dental exams for proper oral hygiene  Community Resource Referral / Chronic Care Management: CRR required this visit?  No   CCM required this visit?  No      Plan:     I have personally reviewed and noted the following in the patient's chart:   Medical and social history Use of alcohol, tobacco or illicit drugs  Current medications and supplements including opioid prescriptions. Patient is not currently taking opioid prescriptions. Functional ability and status Nutritional status Physical activity Advanced directives List of other physicians Hospitalizations, surgeries, and ER visits in previous 12 months Vitals Screenings to include cognitive, depression, and falls Referrals and appointments  In addition,  I have  reviewed and discussed with patient certain preventive protocols, quality metrics, and best practice recommendations. A written personalized care plan for preventive services as well as general preventive health recommendations were provided to patient.   EKG shows rate of 68 bpm, normal sinus rhythm with no acute ST-T wave changes.     12/30/2020   Nurse Notes

## 2020-12-30 NOTE — Progress Notes (Addendum)
Subjective:   Carolyn [name] is a 65 y.o. female who presents for an Initial Medicare Annual Wellness Visit.  Review of Systems    Neg ROS        Objective:    Today's Vitals   12/30/20 1423 12/30/20 1549  BP: 140/73 135/70  Pulse: 91   SpO2: 97%   Weight: 182 lb (82.6 kg)   Height: 5\' 5"  (1.651 m)    Body mass index is 30.29 kg/m.  No flowsheet data found.  Current Medications (verified) Outpatient Encounter Medications as of 12/30/2020  Medication Sig   Ascorbic Acid (VITAMIN C PO) Take 1,000 mg by mouth daily.   Cholecalciferol (VITAMIN D-3 PO) Take 5,000 Units by mouth daily.   cyclobenzaprine (FLEXERIL) 5 MG tablet TAKE 1 OR 2 TABLETS BY MOUTH DAILY AT BEDTIME AS NEEDED FOR MUSCLE SPASMS   cycloSPORINE (RESTASIS) 0.05 % ophthalmic emulsion 1 drop 2 (two) times daily.   Multiple Vitamins-Minerals (ZINC PO) Take by mouth. 22 mg/800 mg daily   rosuvastatin (CRESTOR) 10 MG tablet Take 1 tablet (10 mg total) by mouth at bedtime.   sertraline (ZOLOFT) 25 MG tablet Take 1 tablet (25 mg total) by mouth daily.   Vitamin D, Ergocalciferol, (DRISDOL) 1.25 MG (50000 UNIT) CAPS capsule Take 1 capsule (50,000 Units total) by mouth every 7 (seven) days.   [DISCONTINUED] hydrOXYzine (ATARAX/VISTARIL) 25 MG tablet Take 1 tablet (25 mg total) by mouth 3 (three) times daily as needed for itching.   hydrOXYzine (ATARAX/VISTARIL) 25 MG tablet Take 1 tablet (25 mg total) by mouth 3 (three) times daily as needed for itching.   predniSONE (STERAPRED UNI-PAK 21 TAB) 10 MG (21) TBPK tablet Taper as directed on packaging   [DISCONTINUED] sertraline (ZOLOFT) 100 MG tablet Take 1 tablet (100 mg total) by mouth daily. (Patient taking differently: Take 50 mg by mouth daily. 1/2 tablet qd)   No facility-administered encounter medications on file as of 12/30/2020.    Allergies (verified) Patient has no known allergies.   History: Past Medical History:  Diagnosis Date   Anemia    Anxiety     Chest pain    Chronic pain of both feet    Depression    Fibromyalgia    Flat feet    High cholesterol    Joint pain    Knee pain, bilateral    Obesity    Osteoarthritis    neck   SOB (shortness of breath) on exertion    Vitamin D deficiency    Past Surgical History:  Procedure Laterality Date   TONSILLECTOMY  15   TONSILLECTOMY     TOTAL ABDOMINAL HYSTERECTOMY  10/90   has her left ovary   Family History  Problem Relation Age of Onset   Cancer Other        breast   Diabetes Father    Heart disease Father    Obesity Father    Heart attack Brother    Breast cancer Mother [id number]   Cancer Mother    Lung cancer Brother    Social History   Socioeconomic History   Marital status: Widowed    Spouse name: Not on file   Number of children: Not on file   Years of education: Not on file   Highest education level: Not on file  Occupational History   Occupation: Retired  Tobacco Use   Smoking status: Never   Smokeless tobacco: Never  Vaping Use   Vaping Use: Never used  Substance  and Sexual Activity   Alcohol use: No   Drug use: No   Sexual activity: Not on file  Other Topics Concern   Not on file  Social History Narrative   Not on file   Social Determinants of Health   Financial Resource Strain: Not on file  Food Insecurity: Not on file  Transportation Needs: Not on file  Physical Activity: Not on file  Stress: Not on file  Social Connections: Not on file    Tobacco Counseling Counseling given: Not Answered   Clinical Intake:  Pre-visit preparation completed: Yes        BMI - recorded: 30 Nutritional Status: BMI > 30  Obese Diabetes: No  Diabetic?No  Activities of Daily Living In your present state of health, do you have any difficulty performing the following activities: 12/30/2020 12/30/2020  Hearing? N N  Vision? N N  Difficulty concentrating or making decisions? N N  Walking or climbing stairs? N N  Dressing or bathing? N N  Doing errands,  shopping? N N  Preparing Food and eating ? N -  Using the Toilet? N -  In the past six months, have you accidently leaked urine? N -  Do you have problems with loss of bowel control? N -  Managing your Medications? N -  Managing your Finances? N -  Housekeeping or managing your Housekeeping? N -  Some recent data might be hidden    Patient Care Team: Agapito Games, MD as PCP - General  Indicate any recent Medical Services you may have received from other than [facility] providers in the past year (date may be approximate).     Assessment:   This is a routine wellness examination for Carolyn [name].  Hearing/Vision screen No results found.  Dietary issues and exercise activities discussed: Current Exercise Habits: Structured exercise class, Type of exercise: Other - see comments (water aerobics), Time (Minutes): 30, Frequency (Times/Week): 3, Weekly Exercise (Minutes/Week): 90, Intensity: Moderate, Exercise limited by: None identified   Goals Addressed   None   Depression Screen PHQ 2/9 Scores 12/30/2020 07/01/2020 04/01/2020 04/01/2020 02/22/2020 05/03/2019 05/03/2019  PHQ - 2 Score 0 0 1 1 4 1 1   PHQ- 9 Score - 1 4 - 14 9 -    Fall Risk Fall Risk  12/30/2020 07/01/2020 04/01/2020 03/03/2019  Falls in the past year? 1 0 1 0  Number falls in past yr: 0 - 0 0  Injury with Fall? 0 - 0 0  Risk for fall due to : - No Fall Risks History of fall(s) -  Follow up Falls evaluation completed - Falls evaluation completed -    FALL RISK PREVENTION PERTAINING TO THE HOME:  If so, are there any without handrails? Yes  Home free of loose throw rugs in walkways, pet beds, electrical cords, etc? Yes  Adequate lighting in your home to reduce risk of falls? Yes   ASSISTIVE DEVICES UTILIZED TO PREVENT FALLS:  Life alert? Yes  Use of a cane, walker or w/c? No  Grab bars in the bathroom? Yes  Shower chair or bench in shower? Yes  Elevated toilet seat or a handicapped toilet? No   TIMED UP AND  GO:  Was the test performed? Yes .  Length of time to ambulate 10 feet: 11 sec.   Gait steady and fast without use of assistive device  Cognitive Function:     6CIT Screen 12/30/2020  What Year? 0 points  What month? 0 points  What  time? 0 points  Count back from 20 0 points  Months in reverse 0 points  Repeat phrase 0 points  Total Score 0    Immunizations Immunization History  Administered Date(s) Administered   Fluad Quad(high Dose 65+) 04/01/2020, 12/30/2020   Influenza Split 03/11/2011   Influenza,inj,Quad PF,6+ Mos 02/03/2013, 03/14/2014, 01/18/2015, 01/30/2016, 01/29/2017, 01/31/2018, 03/03/2019   Janssen (J&J) SARS-COV-2 Vaccination 07/28/2019   PNEUMOCOCCAL CONJUGATE-20 12/30/2020   Td 08/28/2009    TDAP status: Due, Education has been provided regarding the importance of this vaccine. Advised may receive this vaccine at local pharmacy or Health Dept. Aware to provide a copy of the vaccination record if obtained from local pharmacy or Health Dept. Verbalized acceptance and understanding.  Flu Vaccine status: Completed at today's visit  Pneumococcal vaccine status: Completed during today's visit.  Covid-19 vaccine status: Completed vaccines  Qualifies for Shingles Vaccine? Yes   Zostavax completed No   Shingrix Completed?: No.    Education has been provided regarding the importance of this vaccine. Patient has been advised to call insurance company to determine out of pocket expense if they have not yet received this vaccine. Advised may also receive vaccine at local pharmacy or Health Dept. Verbalized acceptance and understanding.  Screening Tests Health Maintenance  Topic Date Due   Zoster Vaccines- Shingrix (1 of 2) Never done   Fecal DNA (Cologuard)  09/25/2018   COVID-19 Vaccine (2 - Janssen risk series) 08/25/2019   TETANUS/TDAP  08/29/2019   PNA vac Low Risk Adult (1 of 2 - PCV13) 03/16/2020   MAMMOGRAM  05/29/2022   INFLUENZA VACCINE  Completed    DEXA SCAN  Completed   Hepatitis C Screening  Completed   HIV Screening  Completed   HPV VACCINES  Aged Out    Health Maintenance  Health Maintenance Due  Topic Date Due   Zoster Vaccines- Shingrix (1 of 2) Never done   Fecal DNA (Cologuard)  09/25/2018   COVID-19 Vaccine (2 - Janssen risk series) 08/25/2019   TETANUS/TDAP  08/29/2019   PNA vac Low Risk Adult (1 of 2 - PCV13) 03/16/2020    Colorectal cancer screening: Type of screening: Cologuard. Completed ordered. Repeat every 3 years  Mammogram status: Completed 05/29/2020. Repeat every year  Bone Density status: Completed 05/29/2020. Results reflect: Bone density results: OSTEOPENIA. Repeat every 3-4 years.  Lung Cancer Screening: (Low Dose CT Chest recommended if Age 56-80 years, 30 pack-year currently smoking OR have quit w/in 15years.) does not qualify.   Lung Cancer Screening Referral: NA  Additional Screening:  Hepatitis C Screening: does not qualify; Completed Yes  Vision Screening: Recommended annual ophthalmology exams for early detection of glaucoma and other disorders of the eye. Is the patient up to date with their annual eye exam?  Yes    Dental Screening: Recommended annual dental exams for proper oral hygiene  Community Resource Referral / Chronic Care Management: CRR required this visit?  No   CCM required this visit?  No      Plan:     I have personally reviewed and noted the following in the patient's chart:   Medical and social history Use of alcohol, tobacco or illicit drugs  Current medications and supplements including opioid prescriptions. Patient is not currently taking opioid prescriptions. Functional ability and status Nutritional status Physical activity Advanced directives List of other physicians Hospitalizations, surgeries, and ER visits in previous 12 months Vitals Screenings to include cognitive, depression, and falls Referrals and appointments  In addition, I  have  reviewed and discussed with patient certain preventive protocols, quality metrics, and best practice recommendations. A written personalized care plan for preventive services as well as general preventive health recommendations were provided to patient.   EKG shows rate of 68 bpm, normal sinus rhythm with no acute ST-T wave changes.  She also wanted to know if she could have a refill on her hydroxyzine and prednisone.  She is battling ants in her yard she has exterminator coming to help treat them but when she gets bit she has a really significant reaction it becomes extremely erythematous and large and hot to touch and extremely itchy.     12/30/2020   Nurse Notes

## 2021-01-14 ENCOUNTER — Ambulatory Visit (INDEPENDENT_AMBULATORY_CARE_PROVIDER_SITE_OTHER): Payer: Medicare Other | Admitting: Family Medicine

## 2021-01-17 DIAGNOSIS — Z1211 Encounter for screening for malignant neoplasm of colon: Secondary | ICD-10-CM | POA: Diagnosis not present

## 2021-01-20 ENCOUNTER — Encounter (INDEPENDENT_AMBULATORY_CARE_PROVIDER_SITE_OTHER): Payer: Self-pay | Admitting: Family Medicine

## 2021-01-20 ENCOUNTER — Other Ambulatory Visit: Payer: Self-pay

## 2021-01-20 ENCOUNTER — Ambulatory Visit (INDEPENDENT_AMBULATORY_CARE_PROVIDER_SITE_OTHER): Payer: Medicare Other | Admitting: Family Medicine

## 2021-01-20 VITALS — BP 104/80 | HR 79 | Temp 98.8°F | Ht 65.0 in | Wt 176.0 lb

## 2021-01-20 DIAGNOSIS — J302 Other seasonal allergic rhinitis: Secondary | ICD-10-CM

## 2021-01-20 DIAGNOSIS — E559 Vitamin D deficiency, unspecified: Secondary | ICD-10-CM | POA: Diagnosis not present

## 2021-01-20 DIAGNOSIS — Z6834 Body mass index (BMI) 34.0-34.9, adult: Secondary | ICD-10-CM | POA: Diagnosis not present

## 2021-01-20 DIAGNOSIS — E669 Obesity, unspecified: Secondary | ICD-10-CM

## 2021-01-20 DIAGNOSIS — E7849 Other hyperlipidemia: Secondary | ICD-10-CM

## 2021-01-20 DIAGNOSIS — R7303 Prediabetes: Secondary | ICD-10-CM | POA: Diagnosis not present

## 2021-01-20 MED ORDER — ROSUVASTATIN CALCIUM 10 MG PO TABS: 10.0000 mg | ORAL_TABLET | Freq: Every day | ORAL | 0 refills | Status: DC

## 2021-01-20 MED ORDER — VITAMIN D (ERGOCALCIFEROL) 1.25 MG (50000 UNIT) PO CAPS: 50000.0000 [IU] | ORAL_CAPSULE | ORAL | 0 refills | Status: DC

## 2021-01-20 MED ORDER — MONTELUKAST SODIUM 10 MG PO TABS: 10.0000 mg | ORAL_TABLET | Freq: Every day | ORAL | 0 refills | Status: DC

## 2021-01-21 NOTE — Progress Notes (Signed)
Chief Complaint:   OBESITY Carolyn [name] is here to discuss her progress with her obesity treatment plan along with follow-up of her obesity related diagnoses. [place] is on keeping a food journal and adhering to recommended goals of 1300-1400 calories and 90 grams of protein daily and states she is following her eating plan approximately 75% of the time. Carolyn [name] states she is doing water aerobics for 60 minutes 3 times per week.  Today's visit was #: 15 Starting weight: 207 lbs Starting date: 02/22/2020 Today's weight: 176 lbs Today's date: 01/20/2021 Total lbs lost to date: 31 Total lbs lost since last in-office visit: 0  Interim History: Carolyn [name] forgot her log of calories and protein. The last 5-6 days she hasn't been eating on the plan due to allergy flare, and not feeling her best dur to nasal and head congestion.  Subjective:   1. Other hyperlipidemia Carolyn [name] is tolerating medication(s) well without side effects.  Medication compliance is good and patient appears to be taking it as prescribed.  Denies additional concerns regarding this condition.   2. Vitamin D deficiency Carolyn [name] is currently taking prescription vitamin D 50,000 IU each week. She denies nausea, vomiting or muscle weakness.  3. Pre-diabetes Carolyn [name] has a diagnosis of pre-diabetes based on her elevated HgA1c and was informed this puts her at greater risk of developing diabetes. She continues to work on diet and exercise to decrease her risk of diabetes. She denies nausea or hypoglycemia.  4. Seasonal allergies Carolyn [name] takes prednisone as needed for ant bites 4-5 times per month. She is taking Claritin in the morning, Zyrtec qhs, and Flonase q daily.  Assessment/Plan:   Orders Placed This Encounter  Procedures   Comprehensive metabolic panel   Hemoglobin A1c   Insulin, random   Lipid Panel With LDL/HDL Ratio   VITAMIN D 25 Hydroxy (Vit-D Deficiency, Fractures)    Medications Discontinued During This Encounter   Medication Reason   sertraline (ZOLOFT) 25 MG tablet Error   rosuvastatin (CRESTOR) 10 MG tablet Reorder   Vitamin D, Ergocalciferol, (DRISDOL) 1.25 MG (50000 UNIT) CAPS capsule Reorder     Meds ordered this encounter  Medications   rosuvastatin (CRESTOR) 10 MG tablet    Sig: Take 1 tablet (10 mg total) by mouth at bedtime.    Dispense:  30 tablet    Refill:  0    30 d supply;  ** OV for RF **   Do not send RF request   Vitamin D, Ergocalciferol, (DRISDOL) 1.25 MG (50000 UNIT) CAPS capsule    Sig: Take 1 capsule (50,000 Units total) by mouth every 7 (seven) days.    Dispense:  4 capsule    Refill:  0    30 d supply;  ** OV for RF **   Do not send RF request   montelukast (SINGULAIR) 10 MG tablet    Sig: Take 1 tablet (10 mg total) by mouth at bedtime.    Dispense:  30 tablet    Refill:  0    30 d supply;  ** OV for RF **   Do not send RF request     1. Other hyperlipidemia Cardiovascular risk and specific lipid/LDL goals reviewed.  We discussed several lifestyle modifications today. We will refill Crestor for 1 month, and we will recheck labs at her next office visit. Harman will continue to work on diet, exercise and weight loss efforts. Orders and follow up as documented in patient record.   Counseling  Intensive lifestyle modifications are the first line treatment for this issue. Dietary changes: Increase soluble fiber. Decrease simple carbohydrates. Exercise changes: Moderate to vigorous-intensity aerobic activity 150 minutes per week if tolerated. Lipid-lowering medications: see documented in medical record.  - rosuvastatin (CRESTOR) 10 MG tablet; Take 1 tablet (10 mg total) by mouth at bedtime.  Dispense: 30 tablet; Refill: 0 - Lipid Panel With LDL/HDL Ratio  2. Vitamin D deficiency Low Vitamin D level contributes to fatigue and are associated with obesity, breast, and colon cancer. We will refill prescription Vitamin D for 1 month. We will recheck labs at her next office  visit. Carolyn [name] will follow-up for routine testing of Vitamin D, at least 2-3 times per year to avoid over-replacement.  - Vitamin D, Ergocalciferol, (DRISDOL) 1.25 MG (50000 UNIT) CAPS capsule; Take 1 capsule (50,000 Units total) by mouth every 7 (seven) days.  Dispense: 4 capsule; Refill: 0 - VITAMIN D 25 Hydroxy (Vit-D Deficiency, Fractures)  3. Pre-diabetes Carolyn [name] will continue to work on weight loss, exercise, and decreasing simple carbohydrates to help decrease the risk of diabetes. We will recheck labs at her next office visit.  - Comprehensive metabolic panel - Hemoglobin A1c - Insulin, random  4. Seasonal allergies I recommended only 1 decongestant q daily. Carolyn [name] will continue with Flonase q daily, and she agreed to start a trial of Singulair 10 mg qhs with no refills.  - Seasonal and environmental allergies and pathophysiology of disease process discussed with patient.  - Preventative strategies as first line for management discussed.  I encouraged use of KN or N-95 mask use as well as sterile saline rinses such as Carolyn [name] or AYR sinus rinses to be done twice daily and after any prolonged exposure to the environment or allergen.    It is best to use distilled water or previously boiled water    - If eyes are itchy or irritated feeling when your seasonal allergies get bad, ok to use Naphcon-A over-the-counter eyedrops as needed - If continues to worsen despite preventative strategies, take over-the-counter meds such as Allegra, Xyzal etc daily during allergy seasons or nasal steroids such as Flonase, Rhinocort and the like - We can consider Astelin nasal spray, if symptoms are not well controlled with OTC medications, nasal rinses and preventative strategies. - Encouraged to return to clinic or call the office today discussed further questions or concerns they may have.  - montelukast (SINGULAIR) 10 MG tablet; Take 1 tablet (10 mg total) by mouth at bedtime.  Dispense: 30 tablet; Refill:  0  5. Obesity BMI today 29.3 Carolyn [name] is currently in the action stage of change. As such, her goal is to continue with weight loss efforts. She has agreed to keeping a food journal and adhering to recommended goals of 1300-1400 calories and 90 grams of protein daily.   Carolyn [name] is to bring in her log on calories and protein for her next office visit. We will recheck fasting labs at her next office visit.  Exercise goals: As is.  Behavioral modification strategies: planning for success.  Carolyn [name] has agreed to follow-up with our clinic in 2 to 3 weeks. She was informed of the importance of frequent follow-up visits to maximize her success with intensive lifestyle modifications for her multiple health conditions.   Objective:   Blood pressure 104/80, pulse 79, temperature 98.8 F (37.1 C), height 5\' 5"  (1.651 m), weight 176 lb (79.8 kg), SpO2 99 %. Body mass index is 29.29 kg/m.  General: Cooperative, alert, well  developed, in no acute distress. HEENT: Conjunctivae and lids unremarkable. Cardiovascular: Regular rhythm.  Lungs: Normal work of breathing. Neurologic: No focal deficits.   Lab Results  Component Value Date   CREATININE 0.96 07/15/2020   BUN 16 07/15/2020   NA 142 07/15/2020   K 5.2 07/15/2020   CL 102 07/15/2020   CO2 23 07/15/2020   Lab Results  Component Value Date   ALT 15 07/15/2020   AST 16 07/15/2020   ALKPHOS 78 07/15/2020   BILITOT 0.3 07/15/2020   Lab Results  Component Value Date   HGBA1C 6.0 (H) 07/15/2020   HGBA1C 6.1 (H) 02/22/2020   Lab Results  Component Value Date   INSULIN 6.4 07/15/2020   INSULIN 12.2 02/22/2020   Lab Results  Component Value Date   TSH 0.974 02/22/2020   Lab Results  Component Value Date   CHOL 170 07/15/2020   HDL 66 07/15/2020   LDLCALC 81 07/15/2020   TRIG 135 07/15/2020   CHOLHDL 2.6 07/15/2020   Lab Results  Component Value Date   VD25OH 52.6 07/15/2020   VD25OH 23.9 (L) 02/22/2020   VD25OH 35 03/14/2014    Lab Results  Component Value Date   WBC 8.5 02/22/2020   HGB 13.5 02/22/2020   HCT 41.8 02/22/2020   MCV 92 02/22/2020   PLT 335 02/22/2020   No results found for: IRON, TIBC, FERRITIN  Obesity Behavioral Intervention:   Approximately 15 minutes were spent on the discussion below.  ASK: We discussed the diagnosis of obesity with Carolyn [name] today and Carolyn [name] agreed to give [name] permission to discuss obesity behavioral modification therapy today.  ASSESS: Carolyn [name] has the diagnosis of obesity and her BMI today is 29.3. Carolyn [name] is in the action stage of change.   ADVISE: Carolyn [name] was educated on the multiple health risks of obesity as well as the benefit of weight loss to improve her health. She was advised of the need for long term treatment and the importance of lifestyle modifications to improve her current health and to decrease her risk of future health problems.  AGREE: Multiple dietary modification options and treatment options were discussed and Carolyn [name] agreed to follow the recommendations documented in the above note.  ARRANGE: Carolyn [name] was educated on the importance of frequent visits to treat obesity as outlined per CMS and USPSTF guidelines and agreed to schedule her next follow up appointment today.  Attestation Statements:   Reviewed by clinician on day of visit: allergies, medications, problem list, medical history, surgical history, family history, social history, and previous encounter notes.   Trude Mcburney, am acting as transcriptionist for [employer], DO.  I have reviewed the above documentation for accuracy and completeness, and I agree with the above. Carlye Grippe, D.O.  The 21st Century Cures Act was signed into law in 2016 which includes the topic of electronic health records.  This provides immediate access to information in MyChart.  This includes consultation notes, operative notes, office notes, lab results and pathology reports.  If you have any questions  about what you read please let us know at your next visit so we can discuss your concerns and take corrective action if need be.  We are right here with you.

## 2021-01-23 LAB — COLOGUARD: Cologuard: NEGATIVE

## 2021-01-23 NOTE — Progress Notes (Signed)
Great news! Your Cologuard test is negative.  Recommend repeat colon cancer screening in 3 years.

## 2021-02-10 ENCOUNTER — Encounter (INDEPENDENT_AMBULATORY_CARE_PROVIDER_SITE_OTHER): Payer: Self-pay | Admitting: Physician Assistant

## 2021-02-10 ENCOUNTER — Other Ambulatory Visit: Payer: Self-pay

## 2021-02-10 ENCOUNTER — Ambulatory Visit (INDEPENDENT_AMBULATORY_CARE_PROVIDER_SITE_OTHER): Payer: Medicare Other | Admitting: Physician Assistant

## 2021-02-10 VITALS — BP 102/69 | HR 66 | Temp 98.2°F | Ht 65.0 in | Wt 177.0 lb

## 2021-02-10 DIAGNOSIS — Z6834 Body mass index (BMI) 34.0-34.9, adult: Secondary | ICD-10-CM

## 2021-02-10 DIAGNOSIS — R7303 Prediabetes: Secondary | ICD-10-CM | POA: Diagnosis not present

## 2021-02-10 DIAGNOSIS — E669 Obesity, unspecified: Secondary | ICD-10-CM | POA: Diagnosis not present

## 2021-02-10 DIAGNOSIS — E559 Vitamin D deficiency, unspecified: Secondary | ICD-10-CM | POA: Diagnosis not present

## 2021-02-10 DIAGNOSIS — E7849 Other hyperlipidemia: Secondary | ICD-10-CM | POA: Diagnosis not present

## 2021-02-10 MED ORDER — VITAMIN D (ERGOCALCIFEROL) 1.25 MG (50000 UNIT) PO CAPS: 50000.0000 [IU] | ORAL_CAPSULE | ORAL | 0 refills | Status: DC

## 2021-02-10 MED ORDER — ROSUVASTATIN CALCIUM 10 MG PO TABS: 10.0000 mg | ORAL_TABLET | Freq: Every day | ORAL | 0 refills | Status: DC

## 2021-02-10 NOTE — Progress Notes (Signed)
Chief Complaint:   OBESITY Carolyn [name] is here to discuss her progress with her obesity treatment plan along with follow-up of her obesity related diagnoses. Carolyn [name] is on keeping a food journal and adhering to recommended goals of 1300-1400 calories and 90 grams protein and states she is following her eating plan approximately 50% of the time. Carolyn [name] states she is water exercises 60 minutes 3 times per week.  Today's visit was #: 16 Starting weight: 207 lbs Starting date: 02/22/2020 Today's weight: 177 lbs Today's date: 02/10/2021 Total lbs lost to date: 30 Total lbs lost since last in-office visit: 0  Interim History: Carolyn [name] is only journaling half of the time. She is tired of having to keep track of her food/follow a plan. She lives by herself and does not like to cook dinner some nights. Pt goes to Bible study on Wednesday nights, which can be a challenge as well. She also notes some emotional eating, especially with the anniversary of her husbands passing.  Subjective:   1. Other hyperlipidemia Carolyn [name] is on Crestor and tolerating it well.  2. Vitamin D deficiency Pt is on prescription Vit D and tolerating it well.  3. Pre-diabetes Carolyn [name]'s last A1c was 6.0. She is not on medication.  Assessment/Plan:   1. Other hyperlipidemia Cardiovascular risk and specific lipid/LDL goals reviewed.  We discussed several lifestyle modifications today and Carolyn [name] will continue to work on diet, exercise and weight loss efforts. Orders and follow up as documented in patient record. Continue Crestor as directed.  Counseling Intensive lifestyle modifications are the first line treatment for this issue. Dietary changes: Increase soluble fiber. Decrease simple carbohydrates. Exercise changes: Moderate to vigorous-intensity aerobic activity 150 minutes per week if tolerated. Lipid-lowering medications: see documented in medical record.  Refill- rosuvastatin (CRESTOR) 10 MG tablet; Take 1 tablet (10 mg  total) by mouth at bedtime.  Dispense: 30 tablet; Refill: 0 - Lipid panel  2. Vitamin D deficiency Low Vitamin D level contributes to fatigue and are associated with obesity, breast, and colon cancer. She agrees to continue to take prescription Vitamin D 50,000 IU every week and will follow-up for routine testing of Vitamin D, at least 2-3 times per year to avoid over-replacement. Check labs today.  Refill- Vitamin D, Ergocalciferol, (DRISDOL) 1.25 MG (50000 UNIT) CAPS capsule; Take 1 capsule (50,000 Units total) by mouth every 7 (seven) days.  Dispense: 4 capsule; Refill: 0  - VITAMIN D 25 Hydroxy (Vit-D Deficiency, Fractures)  3. Pre-diabetes Carolyn [name] will continue to work on weight loss, exercise, and decreasing simple carbohydrates to help decrease the risk of diabetes. Check labs today.  - Comprehensive metabolic panel - Hemoglobin A1c - Insulin, random  4. Obesity BMI today 29.45  Carolyn [name] is currently in the action stage of change. As such, her goal is to continue with weight loss efforts. She has agreed to keeping a food journal and adhering to recommended goals of 1300-1400 calories and 90 grams protein.   Exercise goals:  As is  Behavioral modification strategies: planning for success and keeping a strict food journal.  Carolyn [name] has agreed to follow-up with our clinic in 4 weeks. She was informed of the importance of frequent follow-up visits to maximize her success with intensive lifestyle modifications for her multiple health conditions.   Carolyn [name] was informed we would discuss her lab results at her next visit unless there is a critical issue that needs to be addressed sooner. Carolyn [name] agreed to keep her next visit at the agreed  upon time to discuss these results.  Objective:   Blood pressure 102/69, pulse 66, temperature 98.2 F (36.8 C), height 5\' 5"  (1.651 m), weight 177 lb (80.3 kg), SpO2 100 %. Body mass index is 29.45 kg/m.  General: Cooperative, alert, well developed, in no  acute distress. HEENT: Conjunctivae and lids unremarkable. Cardiovascular: Regular rhythm.  Lungs: Normal work of breathing. Neurologic: No focal deficits.   Lab Results  Component Value Date   CREATININE 0.96 07/15/2020   BUN 16 07/15/2020   NA 142 07/15/2020   K 5.2 07/15/2020   CL 102 07/15/2020   CO2 23 07/15/2020   Lab Results  Component Value Date   ALT 15 07/15/2020   AST 16 07/15/2020   ALKPHOS 78 07/15/2020   BILITOT 0.3 07/15/2020   Lab Results  Component Value Date   HGBA1C 6.0 (H) 07/15/2020   HGBA1C 6.1 (H) 02/22/2020   Lab Results  Component Value Date   INSULIN 6.4 07/15/2020   INSULIN 12.2 02/22/2020   Lab Results  Component Value Date   TSH 0.974 02/22/2020   Lab Results  Component Value Date   CHOL 170 07/15/2020   HDL 66 07/15/2020   LDLCALC 81 07/15/2020   TRIG 135 07/15/2020   CHOLHDL 2.6 07/15/2020   Lab Results  Component Value Date   VD25OH 52.6 07/15/2020   VD25OH 23.9 (L) 02/22/2020   VD25OH 35 03/14/2014   Lab Results  Component Value Date   WBC 8.5 02/22/2020   HGB 13.5 02/22/2020   HCT 41.8 02/22/2020   MCV 92 02/22/2020   PLT 335 02/22/2020   No results found for: IRON, TIBC, FERRITIN  Obesity Behavioral Intervention:   Approximately 15 minutes were spent on the discussion below.  ASK: We discussed the diagnosis of obesity with Carolyn [name] today and Carolyn [name] agreed to give 02/24/2020 permission to discuss obesity behavioral modification therapy today.  ASSESS: Carolyn [name] has the diagnosis of obesity and her BMI today is 29.5. Carolyn [name] is in the action stage of change.   ADVISE: Carolyn [name] was educated on the multiple health risks of obesity as well as the benefit of weight loss to improve her health. She was advised of the need for long term treatment and the importance of lifestyle modifications to improve her current health and to decrease her risk of future health problems.  AGREE: Multiple dietary modification options and treatment options  were discussed and Carolyn [name] agreed to follow the recommendations documented in the above note.  ARRANGE: Carolyn [name] was educated on the importance of frequent visits to treat obesity as outlined per CMS and USPSTF guidelines and agreed to schedule her next follow up appointment today.  Attestation Statements:   Reviewed by clinician on day of visit: allergies, medications, problem list, medical history, surgical history, family history, social history, and previous encounter notes.  Sedalia Muta, CMA, am acting as transcriptionist for Edmund Hilda, PA-C.  I have reviewed the above documentation for accuracy and completeness, and I agree with the above. [employer], PA-C

## 2021-02-11 LAB — COMPREHENSIVE METABOLIC PANEL
ALT: 11 IU/L (ref 0–32)
AST: 15 IU/L (ref 0–40)
Albumin/Globulin Ratio: 2.4 — ABNORMAL HIGH (ref 1.2–2.2)
Albumin: 5 g/dL — ABNORMAL HIGH (ref 3.8–4.8)
Alkaline Phosphatase: 72 IU/L (ref 44–121)
BUN/Creatinine Ratio: 24 (ref 12–28)
BUN: 21 mg/dL (ref 8–27)
Bilirubin Total: 0.4 mg/dL (ref 0.0–1.2)
CO2: 25 mmol/L (ref 20–29)
Calcium: 10 mg/dL (ref 8.7–10.3)
Chloride: 101 mmol/L (ref 96–106)
Creatinine, Ser: 0.87 mg/dL (ref 0.57–1.00)
Globulin, Total: 2.1 g/dL (ref 1.5–4.5)
Glucose: 98 mg/dL (ref 70–99)
Potassium: 5.2 mmol/L (ref 3.5–5.2)
Sodium: 140 mmol/L (ref 134–144)
Total Protein: 7.1 g/dL (ref 6.0–8.5)
eGFR: 74 mL/min/{1.73_m2} (ref >59)

## 2021-02-11 LAB — LIPID PANEL
Chol/HDL Ratio: 2.3 ratio (ref 0.0–4.4)
Cholesterol, Total: 175 mg/dL (ref 100–199)
HDL: 76 mg/dL (ref >39)
LDL Chol Calc (NIH): 81 mg/dL (ref 0–99)
Triglycerides: 102 mg/dL (ref 0–149)
VLDL Cholesterol Cal: 18 mg/dL (ref 5–40)

## 2021-02-11 LAB — VITAMIN D 25 HYDROXY (VIT D DEFICIENCY, FRACTURES): Vit D, 25-Hydroxy: 46.7 ng/mL (ref 30.0–100.0)

## 2021-02-11 LAB — HEMOGLOBIN A1C
Est. average glucose Bld gHb Est-mCnc: 128 mg/dL
Hgb A1c MFr Bld: 6.1 % — ABNORMAL HIGH (ref 4.8–5.6)

## 2021-02-11 LAB — INSULIN, RANDOM: INSULIN: 9.8 u[IU]/mL (ref 2.6–24.9)

## 2021-02-26 ENCOUNTER — Encounter (INDEPENDENT_AMBULATORY_CARE_PROVIDER_SITE_OTHER): Payer: Self-pay

## 2021-03-10 ENCOUNTER — Ambulatory Visit (INDEPENDENT_AMBULATORY_CARE_PROVIDER_SITE_OTHER): Payer: Medicare Other | Admitting: Family Medicine

## 2021-03-24 ENCOUNTER — Ambulatory Visit (INDEPENDENT_AMBULATORY_CARE_PROVIDER_SITE_OTHER): Payer: Medicare Other | Admitting: Family Medicine

## 2021-03-24 ENCOUNTER — Encounter (INDEPENDENT_AMBULATORY_CARE_PROVIDER_SITE_OTHER): Payer: Self-pay | Admitting: Family Medicine

## 2021-03-24 ENCOUNTER — Other Ambulatory Visit: Payer: Self-pay

## 2021-03-24 VITALS — BP 120/77 | HR 59 | Temp 97.7°F | Ht 65.0 in | Wt 178.0 lb

## 2021-03-24 DIAGNOSIS — R7303 Prediabetes: Secondary | ICD-10-CM | POA: Diagnosis not present

## 2021-03-24 DIAGNOSIS — Z6834 Body mass index (BMI) 34.0-34.9, adult: Secondary | ICD-10-CM | POA: Diagnosis not present

## 2021-03-24 DIAGNOSIS — E559 Vitamin D deficiency, unspecified: Secondary | ICD-10-CM

## 2021-03-24 DIAGNOSIS — E669 Obesity, unspecified: Secondary | ICD-10-CM | POA: Diagnosis not present

## 2021-03-24 DIAGNOSIS — J302 Other seasonal allergic rhinitis: Secondary | ICD-10-CM

## 2021-03-24 DIAGNOSIS — E7849 Other hyperlipidemia: Secondary | ICD-10-CM

## 2021-03-24 MED ORDER — VITAMIN D (ERGOCALCIFEROL) 1.25 MG (50000 UNIT) PO CAPS: 50000.0000 [IU] | ORAL_CAPSULE | ORAL | 0 refills | Status: DC

## 2021-03-24 MED ORDER — ROSUVASTATIN CALCIUM 10 MG PO TABS: 10.0000 mg | ORAL_TABLET | Freq: Every day | ORAL | 0 refills | Status: DC

## 2021-03-24 MED ORDER — MONTELUKAST SODIUM 10 MG PO TABS: 10.0000 mg | ORAL_TABLET | Freq: Every day | ORAL | 0 refills | Status: DC

## 2021-03-24 NOTE — Progress Notes (Signed)
Chief Complaint:   OBESITY Carolyn [name] is here to discuss her progress with her obesity treatment plan along with follow-up of her obesity related diagnoses. Carolyn [name] is on keeping a food journal and adhering to recommended goals of 1300-1400 calories and 90 grams protein and states she is following her eating plan approximately 50% of the time. Carolyn [name] states she is not currently exercising.  Today's visit was #: 17 Starting weight: 207 lbs Starting date: 02/22/2020 Today's weight: 178 lbs Today's date: 03/24/2021 Total lbs lost to date: 29 Total lbs lost since last in-office visit: +1  Interim History: Carolyn [name]'s last OV was 6 weeks ago. She went to [facility] for 7 days and didn't eat well. She is glad she only gained one pound. She is not really carb craving when eating on plan. Here to review labs.  Subjective:   1. Pre-diabetes Worsening. Discussed labs with patient today. Marcos's fasting insulin is slightly elevated and her A1c has increased to 6.1.  2. Other hyperlipidemia Discussed labs with patient today. Labs at goal. HDL has increased, which is great!  3. Seasonal allergies Symptoms controlled with Singulair.  4. Vitamin D deficiency Discussed labs with patient today. Essentially at goal. Carolyn [name] stopped her OTC Vit D for a while but recently restarted.  Assessment/Plan:  No orders of the defined types were placed in this encounter.   Medications Discontinued During This Encounter  Medication Reason   montelukast (SINGULAIR) 10 MG tablet Reorder   Vitamin D, Ergocalciferol, (DRISDOL) 1.25 MG (50000 UNIT) CAPS capsule Reorder   rosuvastatin (CRESTOR) 10 MG tablet Reorder     Meds ordered this encounter  Medications   montelukast (SINGULAIR) 10 MG tablet    Sig: Take 1 tablet (10 mg total) by mouth at bedtime.    Dispense:  30 tablet    Refill:  0    30 d supply;  ** OV for RF **   Do not send RF request   rosuvastatin (CRESTOR) 10 MG tablet    Sig: Take 1 tablet (10  mg total) by mouth at bedtime.    Dispense:  30 tablet    Refill:  0    30 d supply;  ** OV for RF **   Do not send RF request   Vitamin D, Ergocalciferol, (DRISDOL) 1.25 MG (50000 UNIT) CAPS capsule    Sig: Take 1 capsule (50,000 Units total) by mouth every 7 (seven) days.    Dispense:  4 capsule    Refill:  0    30 d supply;  ** OV for RF **   Do not send RF request     1. Pre-diabetes Carolyn [name] will continue to work on weight loss, exercise, and decreasing simple carbohydrates to help decrease the risk of diabetes.   2. Other hyperlipidemia Cardiovascular risk and specific lipid/LDL goals reviewed.  We discussed several lifestyle modifications today and Carolyn [name] will continue to work on diet, exercise and weight loss efforts. Orders and follow up as documented in patient record.   Counseling Intensive lifestyle modifications are the first line treatment for this issue. Dietary changes: Increase soluble fiber. Decrease simple carbohydrates. Exercise changes: Moderate to vigorous-intensity aerobic activity 150 minutes per week if tolerated. Lipid-lowering medications: see documented in medical record.  Refill- rosuvastatin (CRESTOR) 10 MG tablet; Take 1 tablet (10 mg total) by mouth at bedtime.  Dispense: 30 tablet; Refill: 0  3. Seasonal allergies Continue current treatment plan.  Refill- montelukast (SINGULAIR) 10 MG tablet; Take 1 tablet (  10 mg total) by mouth at bedtime.  Dispense: 30 tablet; Refill: 0  4. Vitamin D deficiency Low Vitamin D level contributes to fatigue and are associated with obesity, breast, and colon cancer. She agrees to continue to take prescription Vitamin D 50,000 IU every week and will follow-up for routine testing of Vitamin D, at least 2-3 times per year to avoid over-replacement.  Refill- Vitamin D, Ergocalciferol, (DRISDOL) 1.25 MG (50000 UNIT) CAPS capsule; Take 1 capsule (50,000 Units total) by mouth every 7 (seven) days.  Dispense: 4 capsule; Refill:  0  5. Obesity with current BMI of 29.6  Carolyn [name] is currently in the action stage of change. As such, her goal is to continue with weight loss efforts. She has agreed to keeping a food journal and adhering to recommended goals of 1300-1400 calories and 90 grams protein.   Pt would like to get back to water, aerobics, and eating on plan more.  Exercise goals: All adults should avoid inactivity. Some physical activity is better than none, and adults who participate in any amount of physical activity gain some health benefits.  Behavioral modification strategies: increasing lean protein intake, decreasing simple carbohydrates, and holiday eating strategies .  Carolyn [name] has agreed to follow-up with our clinic in 2-3 weeks. She was informed of the importance of frequent follow-up visits to maximize her success with intensive lifestyle modifications for her multiple health conditions.   Objective:   Blood pressure 120/77, pulse (!) 59, temperature 97.7 F (36.5 C), height 5\' 5"  (1.651 m), weight 178 lb (80.7 kg), SpO2 97 %. Body mass index is 29.62 kg/m.  General: Cooperative, alert, well developed, in no acute distress. HEENT: Conjunctivae and lids unremarkable. Cardiovascular: Regular rhythm.  Lungs: Normal work of breathing. Neurologic: No focal deficits.   Lab Results  Component Value Date   CREATININE 0.87 02/10/2021   BUN 21 02/10/2021   NA 140 02/10/2021   K 5.2 02/10/2021   CL 101 02/10/2021   CO2 25 02/10/2021   Lab Results  Component Value Date   ALT 11 02/10/2021   AST 15 02/10/2021   ALKPHOS 72 02/10/2021   BILITOT 0.4 02/10/2021   Lab Results  Component Value Date   HGBA1C 6.1 (H) 02/10/2021   HGBA1C 6.0 (H) 07/15/2020   HGBA1C 6.1 (H) 02/22/2020   Lab Results  Component Value Date   INSULIN 9.8 02/10/2021   INSULIN 6.4 07/15/2020   INSULIN 12.2 02/22/2020   Lab Results  Component Value Date   TSH 0.974 02/22/2020   Lab Results  Component Value Date   CHOL  175 02/10/2021   HDL 76 02/10/2021   LDLCALC 81 02/10/2021   TRIG 102 02/10/2021   CHOLHDL 2.3 02/10/2021   Lab Results  Component Value Date   VD25OH 46.7 02/10/2021   VD25OH 52.6 07/15/2020   VD25OH 23.9 (L) 02/22/2020   Lab Results  Component Value Date   WBC 8.5 02/22/2020   HGB 13.5 02/22/2020   HCT 41.8 02/22/2020   MCV 92 02/22/2020   PLT 335 02/22/2020   No results found for: IRON, TIBC, FERRITIN  Obesity Behavioral Intervention:   Approximately 15 minutes were spent on the discussion below.  ASK: We discussed the diagnosis of obesity with Carolyn [name] today and Carolyn [name] agreed to give 02/24/2020 permission to discuss obesity behavioral modification therapy today.  ASSESS: Carolyn [name] has the diagnosis of obesity and her BMI today is 29.6. Carolyn [name] is in the action stage of change.   ADVISE: Carolyn [name] was educated  on the multiple health risks of obesity as well as the benefit of weight loss to improve her health. She was advised of the need for long term treatment and the importance of lifestyle modifications to improve her current health and to decrease her risk of future health problems.  AGREE: Multiple dietary modification options and treatment options were discussed and Carolyn [name] agreed to follow the recommendations documented in the above note.  ARRANGE: Carolyn [name] was educated on the importance of frequent visits to treat obesity as outlined per CMS and USPSTF guidelines and agreed to schedule her next follow up appointment today.  Attestation Statements:   Reviewed by clinician on day of visit: allergies, medications, problem list, medical history, surgical history, family history, social history, and previous encounter notes.  Edmund Hilda, CMA, am acting as transcriptionist for [employer], DO.  I have reviewed the above documentation for accuracy and completeness, and I agree with the above. Carlye Grippe, D.O.  The 21st Century Cures Act was signed into law in 2016 which  includes the topic of electronic health records.  This provides immediate access to information in MyChart.  This includes consultation notes, operative notes, office notes, lab results and pathology reports.  If you have any questions about what you read please let us know at your next visit so we can discuss your concerns and take corrective action if need be.  We are right here with you.

## 2021-04-10 ENCOUNTER — Ambulatory Visit (INDEPENDENT_AMBULATORY_CARE_PROVIDER_SITE_OTHER): Payer: Medicare Other | Admitting: Family Medicine

## 2021-04-10 ENCOUNTER — Encounter (INDEPENDENT_AMBULATORY_CARE_PROVIDER_SITE_OTHER): Payer: Self-pay | Admitting: Family Medicine

## 2021-04-10 ENCOUNTER — Other Ambulatory Visit: Payer: Self-pay

## 2021-04-10 VITALS — BP 110/70 | HR 58 | Temp 97.8°F | Ht 65.0 in | Wt 179.0 lb

## 2021-04-10 DIAGNOSIS — Z9109 Other allergy status, other than to drugs and biological substances: Secondary | ICD-10-CM

## 2021-04-10 DIAGNOSIS — Z6834 Body mass index (BMI) 34.0-34.9, adult: Secondary | ICD-10-CM | POA: Diagnosis not present

## 2021-04-10 DIAGNOSIS — E669 Obesity, unspecified: Secondary | ICD-10-CM | POA: Diagnosis not present

## 2021-04-10 DIAGNOSIS — E7849 Other hyperlipidemia: Secondary | ICD-10-CM | POA: Diagnosis not present

## 2021-04-10 DIAGNOSIS — E559 Vitamin D deficiency, unspecified: Secondary | ICD-10-CM | POA: Diagnosis not present

## 2021-04-10 MED ORDER — VITAMIN D (ERGOCALCIFEROL) 1.25 MG (50000 UNIT) PO CAPS: 50000.0000 [IU] | ORAL_CAPSULE | ORAL | 0 refills | Status: DC

## 2021-04-10 MED ORDER — MONTELUKAST SODIUM 10 MG PO TABS: 10.0000 mg | ORAL_TABLET | Freq: Every day | ORAL | 0 refills | Status: DC

## 2021-04-10 MED ORDER — ROSUVASTATIN CALCIUM 10 MG PO TABS: 10.0000 mg | ORAL_TABLET | Freq: Every day | ORAL | 0 refills | Status: DC

## 2021-04-10 NOTE — Progress Notes (Signed)
Chief Complaint:   OBESITY Carolyn [name] is here to discuss her progress with her obesity treatment plan along with follow-up of her obesity related diagnoses. Carolyn [name] is on keeping a food journal and adhering to recommended goals of 1300-1400 calories and 90 grams protein and states she is following her eating plan approximately 50% of the time. Carolyn [name] states she is not currently exercising.  Today's visit was #: 18 Starting weight: 207 lbs Starting date: 02/22/2020 Today's weight: 179 lbs Today's date: 04/10/2021 Total lbs lost to date: 28 Total lbs lost since last in-office visit: +1  Interim History: Carolyn [name] has not been feeling well for 5 days. She is feeling worse with sore throat, runny nose, cough, congestion, and a lot of tiredness but no bodyaches or fever and chills. However, pt is not getting better as she had expected. She is unable to even go out to shop for food because she is exhausted.  Subjective:   1. Other hyperlipidemia Carolyn [name] has hyperlipidemia and has been trying to improve her cholesterol levels with intensive lifestyle modification including a low saturated fat diet, exercise and weight loss. She denies any chest pain, claudication or myalgias.  2. Environmental allergies Pt's symptoms are controlled with Singulair.  3. Vitamin D deficiency She is currently taking prescription vitamin D 50,000 IU each week. She denies nausea, vomiting or muscle weakness.  Assessment/Plan:  No orders of the defined types were placed in this encounter.   Medications Discontinued During This Encounter  Medication Reason   montelukast (SINGULAIR) 10 MG tablet Reorder   rosuvastatin (CRESTOR) 10 MG tablet Reorder   Vitamin D, Ergocalciferol, (DRISDOL) 1.25 MG (50000 UNIT) CAPS capsule Reorder     Meds ordered this encounter  Medications   montelukast (SINGULAIR) 10 MG tablet    Sig: Take 1 tablet (10 mg total) by mouth at bedtime.    Dispense:  30 tablet    Refill:  0    30 d  supply;  ** OV for RF **   Do not send RF request   rosuvastatin (CRESTOR) 10 MG tablet    Sig: Take 1 tablet (10 mg total) by mouth at bedtime.    Dispense:  30 tablet    Refill:  0    30 d supply;  ** OV for RF **   Do not send RF request   Vitamin D, Ergocalciferol, (DRISDOL) 1.25 MG (50000 UNIT) CAPS capsule    Sig: Take 1 capsule (50,000 Units total) by mouth every 7 (seven) days.    Dispense:  4 capsule    Refill:  0    30 d supply;  ** OV for RF **   Do not send RF request     1. Other hyperlipidemia Cardiovascular risk and specific lipid/LDL goals reviewed.  We discussed several lifestyle modifications today and Carolyn [name] will continue to work on diet, exercise and weight loss efforts. Orders and follow up as documented in patient record.   Counseling Intensive lifestyle modifications are the first line treatment for this issue. Dietary changes: Increase soluble fiber. Decrease simple carbohydrates. Exercise changes: Moderate to vigorous-intensity aerobic activity 150 minutes per week if tolerated. Lipid-lowering medications: see documented in medical record.  Refill- rosuvastatin (CRESTOR) 10 MG tablet; Take 1 tablet (10 mg total) by mouth at bedtime.  Dispense: 30 tablet; Refill: 0  2. Environmental allergies Continue current treatment plan.  Refill- montelukast (SINGULAIR) 10 MG tablet; Take 1 tablet (10 mg total) by mouth at bedtime.  Dispense:  30 tablet; Refill: 0  3. Vitamin D deficiency Low Vitamin D level contributes to fatigue and are associated with obesity, breast, and colon cancer. She agrees to continue to take prescription Vitamin D 50,000 IU every week and will follow-up for routine testing of Vitamin D, at least 2-3 times per year to avoid over-replacement.  Refill- Vitamin D, Ergocalciferol, (DRISDOL) 1.25 MG (50000 UNIT) CAPS capsule; Take 1 capsule (50,000 Units total) by mouth every 7 (seven) days.  Dispense: 4 capsule; Refill: 0  4. Obesity with current BMI  of 29.8  Carolyn [name] is currently in the action stage of change. As such, her goal is to continue with weight loss efforts. She has agreed to keeping a food journal and adhering to recommended goals of 1300-1400 calories and 90+ grams protein.   Goal is to not gain weight over Christmas.  Exercise goals:  As is  Behavioral modification strategies: increasing lean protein intake and celebration eating strategies.  Carolyn [name] has agreed to follow-up with our clinic in 4 weeks. She was informed of the importance of frequent follow-up visits to maximize her success with intensive lifestyle modifications for her multiple health conditions.   Objective:   Blood pressure 110/70, pulse (!) 58, temperature 97.8 F (36.6 C), height 5\' 5"  (1.651 m), weight 179 lb (81.2 kg), SpO2 96 %. Body mass index is 29.79 kg/m.  General: Cooperative, alert, well developed, in no acute distress. HEENT: Conjunctivae and lids unremarkable. Cardiovascular: Regular rhythm.  Lungs: Normal work of breathing. Neurologic: No focal deficits.   Lab Results  Component Value Date   CREATININE 0.87 02/10/2021   BUN 21 02/10/2021   NA 140 02/10/2021   K 5.2 02/10/2021   CL 101 02/10/2021   CO2 25 02/10/2021   Lab Results  Component Value Date   ALT 11 02/10/2021   AST 15 02/10/2021   ALKPHOS 72 02/10/2021   BILITOT 0.4 02/10/2021   Lab Results  Component Value Date   HGBA1C 6.1 (H) 02/10/2021   HGBA1C 6.0 (H) 07/15/2020   HGBA1C 6.1 (H) 02/22/2020   Lab Results  Component Value Date   INSULIN 9.8 02/10/2021   INSULIN 6.4 07/15/2020   INSULIN 12.2 02/22/2020   Lab Results  Component Value Date   TSH 0.974 02/22/2020   Lab Results  Component Value Date   CHOL 175 02/10/2021   HDL 76 02/10/2021   LDLCALC 81 02/10/2021   TRIG 102 02/10/2021   CHOLHDL 2.3 02/10/2021   Lab Results  Component Value Date   VD25OH 46.7 02/10/2021   VD25OH 52.6 07/15/2020   VD25OH 23.9 (L) 02/22/2020   Lab Results   Component Value Date   WBC 8.5 02/22/2020   HGB 13.5 02/22/2020   HCT 41.8 02/22/2020   MCV 92 02/22/2020   PLT 335 02/22/2020   No results found for: IRON, TIBC, FERRITIN  Obesity Behavioral Intervention:   Approximately 15 minutes were spent on the discussion below.  ASK: We discussed the diagnosis of obesity with Carolyn [name] today and Carolyn [name] agreed to give 02/24/2020 permission to discuss obesity behavioral modification therapy today.  ASSESS: Carolyn [name] has the diagnosis of obesity and her BMI today is 29.8. Carolyn [name] is in the action stage of change.   ADVISE: Carolyn [name] was educated on the multiple health risks of obesity as well as the benefit of weight loss to improve her health. She was advised of the need for long term treatment and the importance of lifestyle modifications to improve her current health and to  decrease her risk of future health problems.  AGREE: Multiple dietary modification options and treatment options were discussed and Carolyn [name] agreed to follow the recommendations documented in the above note.  ARRANGE: Carolyn [name] was educated on the importance of frequent visits to treat obesity as outlined per CMS and USPSTF guidelines and agreed to schedule her next follow up appointment today.  Attestation Statements:   Reviewed by clinician on day of visit: allergies, medications, problem list, medical history, surgical history, family history, social history, and previous encounter notes.  Edmund Hilda, CMA, am acting as transcriptionist for [employer], DO.  I have reviewed the above documentation for accuracy and completeness, and I agree with the above. Carlye Grippe, D.O.  The 21st Century Cures Act was signed into law in 2016 which includes the topic of electronic health records.  This provides immediate access to information in MyChart.  This includes consultation notes, operative notes, office notes, lab results and pathology reports.  If you have any questions about what  you read please let us know at your next visit so we can discuss your concerns and take corrective action if need be.  We are right here with you.

## 2021-05-12 ENCOUNTER — Encounter (INDEPENDENT_AMBULATORY_CARE_PROVIDER_SITE_OTHER): Payer: Self-pay | Admitting: Family Medicine

## 2021-05-12 ENCOUNTER — Ambulatory Visit (INDEPENDENT_AMBULATORY_CARE_PROVIDER_SITE_OTHER): Payer: Medicare Other | Admitting: Family Medicine

## 2021-05-12 ENCOUNTER — Other Ambulatory Visit: Payer: Self-pay

## 2021-05-12 VITALS — BP 124/81 | HR 69 | Temp 98.3°F | Ht 65.0 in | Wt 180.0 lb

## 2021-05-12 DIAGNOSIS — E559 Vitamin D deficiency, unspecified: Secondary | ICD-10-CM | POA: Diagnosis not present

## 2021-05-12 DIAGNOSIS — R7303 Prediabetes: Secondary | ICD-10-CM

## 2021-05-12 DIAGNOSIS — E669 Obesity, unspecified: Secondary | ICD-10-CM | POA: Diagnosis not present

## 2021-05-12 DIAGNOSIS — E7849 Other hyperlipidemia: Secondary | ICD-10-CM

## 2021-05-12 DIAGNOSIS — Z6834 Body mass index (BMI) 34.0-34.9, adult: Secondary | ICD-10-CM

## 2021-05-12 DIAGNOSIS — Z6830 Body mass index (BMI) 30.0-30.9, adult: Secondary | ICD-10-CM

## 2021-05-12 MED ORDER — VITAMIN D (ERGOCALCIFEROL) 1.25 MG (50000 UNIT) PO CAPS: 50000.0000 [IU] | ORAL_CAPSULE | ORAL | 0 refills | Status: DC

## 2021-05-12 MED ORDER — ROSUVASTATIN CALCIUM 10 MG PO TABS: 10.0000 mg | ORAL_TABLET | Freq: Every day | ORAL | 0 refills | Status: DC

## 2021-05-13 NOTE — Progress Notes (Signed)
Chief Complaint:   OBESITY Carolyn [name] is here to discuss her progress with her obesity treatment plan along with follow-up of her obesity related diagnoses. Carolyn [name] is on keeping a food journal and adhering to recommended goals of 1300-1400 calories and 90+ grams protein and states she is following her eating plan approximately 30% of the time. Carolyn [name] states she is not currently exercising.  Today's visit was #: 19 Starting weight: 207 lbs Starting date: 02/22/2020 Today's weight: 180 lbs Today's date: 05/12/2021 Total lbs lost to date: 27 Total lbs lost since last in-office visit: +1  Interim History: Pt is pleasantly surprised she only gained 1 lb over the holidays. She plans to get 10K steps 3 days a week and to do water aerobics 2 days a week.  Subjective:   1. Other hyperlipidemia Carolyn [name] has hyperlipidemia and has been trying to improve her cholesterol levels with intensive lifestyle modification including a low saturated fat diet, exercise and weight loss. She denies any chest pain, claudication or myalgias.  2. Prediabetes Carolyn [name] has a diagnosis of prediabetes based on her elevated HgA1c and was informed this puts her at greater risk of developing diabetes. She continues to work on diet and exercise to decrease her risk of diabetes. She denies nausea or hypoglycemia.  3. Vitamin D deficiency She is currently taking prescription vitamin D 50,000 IU each week. She denies nausea, vomiting or muscle weakness.  Assessment/Plan:  No orders of the defined types were placed in this encounter.   Medications Discontinued During This Encounter  Medication Reason   rosuvastatin (CRESTOR) 10 MG tablet Reorder   Vitamin D, Ergocalciferol, (DRISDOL) 1.25 MG (50000 UNIT) CAPS capsule Reorder     Meds ordered this encounter  Medications   rosuvastatin (CRESTOR) 10 MG tablet    Sig: Take 1 tablet (10 mg total) by mouth at bedtime.    Dispense:  30 tablet    Refill:  0    30 d supply;  **  OV for RF **   Do not send RF request   Vitamin D, Ergocalciferol, (DRISDOL) 1.25 MG (50000 UNIT) CAPS capsule    Sig: Take 1 capsule (50,000 Units total) by mouth every 7 (seven) days.    Dispense:  4 capsule    Refill:  0    30 d supply;  ** OV for RF **   Do not send RF request     1. Other hyperlipidemia Cardiovascular risk and specific lipid/LDL goals reviewed.  We discussed several lifestyle modifications today and Carolyn [name] will continue to work on diet, exercise and weight loss efforts. Orders and follow up as documented in patient record. Check labs at next OV.  Counseling Intensive lifestyle modifications are the first line treatment for this issue. Dietary changes: Increase soluble fiber. Decrease simple carbohydrates. Exercise changes: Moderate to vigorous-intensity aerobic activity 150 minutes per week if tolerated. Lipid-lowering medications: see documented in medical record.  Refill- rosuvastatin (CRESTOR) 10 MG tablet; Take 1 tablet (10 mg total) by mouth at bedtime.  Dispense: 30 tablet; Refill: 0  2. Prediabetes Carolyn [name] will continue to work on weight loss, exercise, and decreasing simple carbohydrates to help decrease the risk of diabetes. Check labs at next OV.  3. Vitamin D deficiency Low Vitamin D level contributes to fatigue and are associated with obesity, breast, and colon cancer. She agrees to continue to take prescription Vitamin D 50,000 IU every week and will follow-up for routine testing of Vitamin D, at least 2-3 times  per year to avoid over-replacement. Check labs at next OV.  Refill- Vitamin D, Ergocalciferol, (DRISDOL) 1.25 MG (50000 UNIT) CAPS capsule; Take 1 capsule (50,000 Units total) by mouth every 7 (seven) days.  Dispense: 4 capsule; Refill: 0  4. Obesity with current BMI of 30.1  Carolyn [name] is currently in the action stage of change. As such, her goal is to continue with weight loss efforts. She has agreed to keeping a food journal and adhering to  recommended goals of 1300-1400 calories and 90+ grams protein.   Pt will try to get back to journaling and tracking.  Exercise goals: For substantial health benefits, adults should do at least 150 minutes (2 hours and 30 minutes) a week of moderate-intensity, or 75 minutes (1 hour and 15 minutes) a week of vigorous-intensity aerobic physical activity, or an equivalent combination of moderate- and vigorous-intensity aerobic activity. Aerobic activity should be performed in episodes of at least 10 minutes, and preferably, it should be spread throughout the week.  Behavioral modification strategies: planning for success.  Carolyn [name] has agreed to follow-up with our clinic in 3 weeks. She was informed of the importance of frequent follow-up visits to maximize her success with intensive lifestyle modifications for her multiple health conditions.   Carolyn [name] was informed we would discuss her lab results at her next visit unless there is a critical issue that needs to be addressed sooner. Carolyn [name] agreed to keep her next visit at the agreed upon time to discuss these results.  Objective:   Blood pressure 124/81, pulse 69, temperature 98.3 F (36.8 C), height 5\' 5"  (1.651 m), weight 180 lb (81.6 kg), SpO2 99 %. Body mass index is 29.95 kg/m.  General: Cooperative, alert, well developed, in no acute distress. HEENT: Conjunctivae and lids unremarkable. Cardiovascular: Regular rhythm.  Lungs: Normal work of breathing. Neurologic: No focal deficits.   Lab Results  Component Value Date   CREATININE 0.87 02/10/2021   BUN 21 02/10/2021   NA 140 02/10/2021   K 5.2 02/10/2021   CL 101 02/10/2021   CO2 25 02/10/2021   Lab Results  Component Value Date   ALT 11 02/10/2021   AST 15 02/10/2021   ALKPHOS 72 02/10/2021   BILITOT 0.4 02/10/2021   Lab Results  Component Value Date   HGBA1C 6.1 (H) 02/10/2021   HGBA1C 6.0 (H) 07/15/2020   HGBA1C 6.1 (H) 02/22/2020   Lab Results  Component Value Date    INSULIN 9.8 02/10/2021   INSULIN 6.4 07/15/2020   INSULIN 12.2 02/22/2020   Lab Results  Component Value Date   TSH 0.974 02/22/2020   Lab Results  Component Value Date   CHOL 175 02/10/2021   HDL 76 02/10/2021   LDLCALC 81 02/10/2021   TRIG 102 02/10/2021   CHOLHDL 2.3 02/10/2021   Lab Results  Component Value Date   VD25OH 46.7 02/10/2021   VD25OH 52.6 07/15/2020   VD25OH 23.9 (L) 02/22/2020   Lab Results  Component Value Date   WBC 8.5 02/22/2020   HGB 13.5 02/22/2020   HCT 41.8 02/22/2020   MCV 92 02/22/2020   PLT 335 02/22/2020   No results found for: IRON, TIBC, FERRITIN  Obesity Behavioral Intervention:   Approximately 15 minutes were spent on the discussion below.  ASK: We discussed the diagnosis of obesity with Carolyn [name] today and Carolyn [name] agreed to give Carolyn [name] permission to discuss obesity behavioral modification therapy today.  ASSESS: Tanyla has the diagnosis of obesity and her BMI today is 30.1. Carolyn [name]  is in the action stage of change.   ADVISE: Carolyn [name] was educated on the multiple health risks of obesity as well as the benefit of weight loss to improve her health. She was advised of the need for long term treatment and the importance of lifestyle modifications to improve her current health and to decrease her risk of future health problems.  AGREE: Multiple dietary modification options and treatment options were discussed and Carolyn [name] agreed to follow the recommendations documented in the above note.  ARRANGE: Carolyn [name] was educated on the importance of frequent visits to treat obesity as outlined per CMS and USPSTF guidelines and agreed to schedule her next follow up appointment today.  Attestation Statements:   Reviewed by clinician on day of visit: allergies, medications, problem list, medical history, surgical history, family history, social history, and previous encounter notes.  Carolyn [name], CMA, am acting as transcriptionist for [employer], DO.  I  have reviewed the above documentation for accuracy and completeness, and I agree with the above. Carlye Grippe, D.O.  The 21st Century Cures Act was signed into law in 2016 which includes the topic of electronic health records.  This provides immediate access to information in MyChart.  This includes consultation notes, operative notes, office notes, lab results and pathology reports.  If you have any questions about what you read please let us know at your next visit so we can discuss your concerns and take corrective action if need be.  We are right here with you.

## 2021-06-02 ENCOUNTER — Other Ambulatory Visit: Payer: Self-pay

## 2021-06-02 ENCOUNTER — Encounter (INDEPENDENT_AMBULATORY_CARE_PROVIDER_SITE_OTHER): Payer: Self-pay | Admitting: Family Medicine

## 2021-06-02 ENCOUNTER — Ambulatory Visit (INDEPENDENT_AMBULATORY_CARE_PROVIDER_SITE_OTHER): Payer: Medicare Other | Admitting: Family Medicine

## 2021-06-02 VITALS — BP 106/71 | HR 68 | Temp 98.5°F | Ht 65.0 in | Wt 183.0 lb

## 2021-06-02 DIAGNOSIS — Z6830 Body mass index (BMI) 30.0-30.9, adult: Secondary | ICD-10-CM

## 2021-06-02 DIAGNOSIS — Z9109 Other allergy status, other than to drugs and biological substances: Secondary | ICD-10-CM

## 2021-06-02 DIAGNOSIS — Z6834 Body mass index (BMI) 34.0-34.9, adult: Secondary | ICD-10-CM

## 2021-06-02 DIAGNOSIS — E669 Obesity, unspecified: Secondary | ICD-10-CM | POA: Diagnosis not present

## 2021-06-02 DIAGNOSIS — E7849 Other hyperlipidemia: Secondary | ICD-10-CM | POA: Diagnosis not present

## 2021-06-02 DIAGNOSIS — E559 Vitamin D deficiency, unspecified: Secondary | ICD-10-CM | POA: Diagnosis not present

## 2021-06-02 MED ORDER — ROSUVASTATIN CALCIUM 10 MG PO TABS: 10.0000 mg | ORAL_TABLET | Freq: Every day | ORAL | 1 refills | Status: DC

## 2021-06-02 MED ORDER — MONTELUKAST SODIUM 10 MG PO TABS: 10.0000 mg | ORAL_TABLET | Freq: Every day | ORAL | 1 refills | Status: DC

## 2021-06-02 MED ORDER — VITAMIN D (ERGOCALCIFEROL) 1.25 MG (50000 UNIT) PO CAPS: 50000.0000 [IU] | ORAL_CAPSULE | ORAL | 1 refills | Status: DC

## 2021-06-03 NOTE — Progress Notes (Signed)
Chief Complaint:   OBESITY Carolyn [name] is here to discuss her progress with her obesity treatment plan along with follow-up of her obesity related diagnoses. Carolyn [name] is on keeping a food journal and adhering to recommended goals of 1300-1400 calories and 90+ grams of protein daily and states she is following her eating plan approximately 80% of the time. Carolyn [name] states she is doing water aerobics and walking the dogs for 20-60 minutes 3-5 times per week.  Today's visit was #: 20 Starting weight: 207 lbs Starting date: 02/22/2020 Today's weight: 183 lbs Today's date: 06/02/2021 Total lbs lost to date: 24 Total lbs lost since last in-office visit: 0  Interim History:   Carolyn [name] had her 67 year old granddaughter over the weekend, and she hasn't journaled in the past 4-5 days.  She has been eating high carbohydrate, higher fat and low protein foods.  She is frustrated with her 3 lb wt gain today.    She also states this may be her last OV with [name] due to financial reasons.  I gave her refills for 90 days so she would have enough meds and enough time to get to her PCP's office in the future.     Subjective:   1. Other hyperlipidemia Carolyn [name] has hyperlipidemia and has been trying to improve her cholesterol levels with intensive lifestyle modification including a low saturated fat diet, exercise and weight loss. She denies any chest pain, claudication or myalgias.  2. Environmental allergies Carolyn [name] has seasonal allergies that have been more bothersome with the change of weather/season.  3. Vitamin D deficiency Carolyn [name] is currently taking prescription vitamin D 50,000 IU each week. She denies nausea, vomiting or muscle weakness.    Assessment/Plan:  No orders of the defined types were placed in this encounter.   Medications Discontinued During This Encounter  Medication Reason   montelukast (SINGULAIR) 10 MG tablet Reorder   rosuvastatin (CRESTOR) 10 MG tablet Reorder   Vitamin D,  Ergocalciferol, (DRISDOL) 1.25 MG (50000 UNIT) CAPS capsule Reorder     Meds ordered this encounter  Medications   montelukast (SINGULAIR) 10 MG tablet    Sig: Take 1 tablet (10 mg total) by mouth at bedtime.    Dispense:  90 tablet    Refill:  1   rosuvastatin (CRESTOR) 10 MG tablet    Sig: Take 1 tablet (10 mg total) by mouth at bedtime.    Dispense:  90 tablet    Refill:  1   Vitamin D, Ergocalciferol, (DRISDOL) 1.25 MG (50000 UNIT) CAPS capsule    Sig: Take 1 capsule (50,000 Units total) by mouth every 7 (seven) days.    Dispense:  12 capsule    Refill:  1    30 d supply;  ** OV for RF **   Do not send RF request     1. Other hyperlipidemia Cardiovascular risk and specific lipid/LDL goals reviewed. We discussed several lifestyle modifications today. We will refill Crestor for 90 days with 1 refill. Carolyn [name] will continue to work on diet, exercise and weight loss efforts. Orders and follow up as documented in patient record.   Counseling Intensive lifestyle modifications are the first line treatment for this issue. Dietary changes: Increase soluble fiber. Decrease simple carbohydrates. Exercise changes: Moderate to vigorous-intensity aerobic activity 150 minutes per week if tolerated. Lipid-lowering medications: see documented in medical record.  - rosuvastatin (CRESTOR) 10 MG tablet; Take 1 tablet (10 mg total) by mouth at bedtime.  Dispense: 90  tablet; Refill: 1  2. Environmental allergies We will refill Singulair for 90 days with 1 refill.  Counseling:  - Seasonal and environmental allergies and pathophysiology of disease process discussed with patient.  - Preventative strategies as first line for management discussed.  I encouraged use of KN or N-95 mask use as well as sterile saline rinses such as Milta Deiters Med or AYR sinus rinses to be done twice daily and after any prolonged exposure to the environment or allergen.    It is best to use distilled water or previously boiled water     - If eyes are itchy or irritated feeling when your seasonal allergies get bad, ok to use Naphcon-A over-the-counter eyedrops as needed - If continues to worsen despite preventative strategies, take over-the-counter meds such as Allegra, Xyzal etc daily during allergy seasons or nasal steroids such as Flonase, Rhinocort and the like - We can consider Astelin nasal spray, if symptoms are not well controlled with OTC medications, nasal rinses and preventative strategies. - Encouraged to return to clinic or call the office today discussed further questions or concerns they may have.  - montelukast (SINGULAIR) 10 MG tablet; Take 1 tablet (10 mg total) by mouth at bedtime.  Dispense: 90 tablet; Refill: 1  3. Vitamin D deficiency We will refill prescription Vitamin D 50,000 IU every week for 90 days with 1 refill. Carolyn [name] will follow-up for routine testing of Vitamin D, at least 2 times per year to avoid over-replacement.  - Vitamin D, Ergocalciferol, (DRISDOL) 1.25 MG (50000 UNIT) CAPS capsule; Take 1 capsule (50,000 Units total) by mouth every 7 (seven) days.  Dispense: 12 capsule; Refill: 1  4. Obesity with current BMI of 30.5 Carolyn [name] is not currently in the action stage of change. As such, her goal is to continue with weight loss efforts. She has agreed to keeping a food journal and adhering to recommended goals of 1300-1400 calories and 90+ grams of protein daily.   I informed Carolyn [name] about the need to have a f/up OV  ( in order to stay an established pt) within 6 months or less, or else she will need a new patient office visit again and will also have to pay the program fee again. She verbalized understanding.  Also made pt aware of our upcoming office out in [place], [place] in the future since she lives there  Exercise goals: For substantial health benefits, adults should do at least 150 minutes (2 hours and 30 minutes) a week of moderate-intensity, or 75 minutes (1 hour and 15 minutes) a week of  vigorous-intensity aerobic physical activity, or an equivalent combination of moderate- and vigorous-intensity aerobic activity. Aerobic activity should be performed in episodes of at least 10 minutes, and preferably, it should be spread throughout the week. Or As is.  Behavioral modification strategies: keeping a strict food journal (even when eating off the meal plan).  Carolyn [name] has agreed to follow-up with our clinic in 5 months or so ( less than 6 mo) when she is ready.  She was informed of the importance of frequent follow-up visits to maximize her success with intensive lifestyle modifications for her multiple health conditions.   Objective:   Blood pressure 106/71, pulse 68, temperature 98.5 F (36.9 C), height 5\' 5"  (1.651 m), weight 183 lb (83 kg), SpO2 (!) 85 %. Body mass index is 30.45 kg/m.  General: Cooperative, alert, well developed, in no acute distress. HEENT: Conjunctivae and lids unremarkable. Cardiovascular: Regular rhythm.  Lungs: Normal work of  breathing. Neurologic: No focal deficits.   Lab Results  Component Value Date   CREATININE 0.87 02/10/2021   BUN 21 02/10/2021   NA 140 02/10/2021   K 5.2 02/10/2021   CL 101 02/10/2021   CO2 25 02/10/2021   Lab Results  Component Value Date   ALT 11 02/10/2021   AST 15 02/10/2021   ALKPHOS 72 02/10/2021   BILITOT 0.4 02/10/2021   Lab Results  Component Value Date   HGBA1C 6.1 (H) 02/10/2021   HGBA1C 6.0 (H) 07/15/2020   HGBA1C 6.1 (H) 02/22/2020   Lab Results  Component Value Date   INSULIN 9.8 02/10/2021   INSULIN 6.4 07/15/2020   INSULIN 12.2 02/22/2020   Lab Results  Component Value Date   TSH 0.974 02/22/2020   Lab Results  Component Value Date   CHOL 175 02/10/2021   HDL 76 02/10/2021   LDLCALC 81 02/10/2021   TRIG 102 02/10/2021   CHOLHDL 2.3 02/10/2021   Lab Results  Component Value Date   VD25OH 46.7 02/10/2021   VD25OH 52.6 07/15/2020   VD25OH 23.9 (L) 02/22/2020   Lab Results   Component Value Date   WBC 8.5 02/22/2020   HGB 13.5 02/22/2020   HCT 41.8 02/22/2020   MCV 92 02/22/2020   PLT 335 02/22/2020   No results found for: IRON, TIBC, FERRITIN  Attestation Statements:   Reviewed by clinician on day of visit: allergies, medications, problem list, medical history, surgical history, family history, social history, and previous encounter notes.  Wilhemena Durie, am acting as transcriptionist for [employer], DO.  I have reviewed the above documentation for accuracy and completeness, and I agree with the above. Marjory Sneddon, D.O.  The [place] was signed into law in 2016 which includes the topic of electronic health records.  This provides immediate access to information in MyChart.  This includes consultation notes, operative notes, office notes, lab results and pathology reports.  If you have any questions about what you read please let us know at your next visit so we can discuss your concerns and take corrective action if need be.  We are right here with you.

## 2021-07-30 ENCOUNTER — Other Ambulatory Visit: Payer: Self-pay | Admitting: Family Medicine

## 2021-07-30 DIAGNOSIS — Z1231 Encounter for screening mammogram for malignant neoplasm of breast: Secondary | ICD-10-CM

## 2021-07-31 ENCOUNTER — Ambulatory Visit (INDEPENDENT_AMBULATORY_CARE_PROVIDER_SITE_OTHER): Payer: Medicare Other

## 2021-07-31 DIAGNOSIS — Z1231 Encounter for screening mammogram for malignant neoplasm of breast: Secondary | ICD-10-CM

## 2021-07-31 NOTE — Progress Notes (Signed)
HI Kella, your mammo shows a questionable area in the left breast.  The imaging dept will call you to schedule you for additional views.

## 2021-08-01 ENCOUNTER — Other Ambulatory Visit: Payer: Self-pay | Admitting: Nurse Practitioner

## 2021-08-01 ENCOUNTER — Other Ambulatory Visit: Payer: Self-pay | Admitting: Family Medicine

## 2021-08-01 DIAGNOSIS — R928 Other abnormal and inconclusive findings on diagnostic imaging of breast: Secondary | ICD-10-CM

## 2021-08-05 ENCOUNTER — Ambulatory Visit
Admission: RE | Admit: 2021-08-05 | Discharge: 2021-08-05 | Disposition: A | Discharge status: Home / Self Care | Payer: Medicare Other | Source: Ambulatory Visit | Attending: Family Medicine | Admitting: Family Medicine

## 2021-08-05 ENCOUNTER — Other Ambulatory Visit: Payer: Self-pay | Admitting: Family Medicine

## 2021-08-05 DIAGNOSIS — R928 Other abnormal and inconclusive findings on diagnostic imaging of breast: Secondary | ICD-10-CM

## 2021-08-05 DIAGNOSIS — N6489 Other specified disorders of breast: Secondary | ICD-10-CM | POA: Diagnosis not present

## 2021-08-05 NOTE — Progress Notes (Signed)
GREAT news. Improved appearance of the outer left breast. 6 month screening with diagnostic and ultrasound for follow up.

## 2021-12-10 ENCOUNTER — Encounter (INDEPENDENT_AMBULATORY_CARE_PROVIDER_SITE_OTHER): Payer: Self-pay

## 2022-01-06 ENCOUNTER — Ambulatory Visit: Payer: Medicare Other

## 2022-01-07 ENCOUNTER — Ambulatory Visit (INDEPENDENT_AMBULATORY_CARE_PROVIDER_SITE_OTHER): Payer: Medicare Other | Admitting: Family Medicine

## 2022-01-07 VITALS — BP 122/75 | HR 66 | Ht 65.0 in | Wt 196.1 lb

## 2022-01-07 DIAGNOSIS — Z0000 Encounter for general adult medical examination without abnormal findings: Secondary | ICD-10-CM

## 2022-01-07 NOTE — Patient Instructions (Addendum)
MEDICARE ANNUAL WELLNESS VISIT Health Maintenance Summary and Written Plan of Care  Carolyn [name] ,  Thank you for allowing me to perform your Medicare Annual Wellness Visit and for your ongoing commitment to your health.   Health Maintenance & Immunization History Health Maintenance  Topic Date Due   COVID-19 Vaccine (2 - Janssen risk series) 01/23/2022 (Originally 08/25/2019)   Zoster Vaccines- Shingrix (1 of 2) 04/08/2022 (Originally 03/16/1975)   INFLUENZA VACCINE  08/02/2022 (Originally 12/02/2021)   TETANUS/TDAP  01/08/2023 (Originally 08/29/2019)   MAMMOGRAM  08/01/2023   Fecal DNA (Cologuard)  01/18/2024   Pneumonia Vaccine 33+ Years old  Completed   DEXA SCAN  Completed   Hepatitis C Screening  Completed   HPV VACCINES  Aged Out   Immunization History  Administered Date(s) Administered   Fluad Quad(high Dose 65+) 04/01/2020, 12/30/2020   Influenza Split 03/11/2011   Influenza,inj,Quad PF,6+ Mos 02/03/2013, 03/14/2014, 01/18/2015, 01/30/2016, 01/29/2017, 01/31/2018, 03/03/2019   Janssen (J&J) SARS-COV-2 Vaccination 07/28/2019   PNEUMOCOCCAL CONJUGATE-20 12/30/2020   Td 08/28/2009    These are the patient goals that we discussed:  Goals Addressed               This Visit's Progress     Patient Stated (pt-stated)        Would like to loose weight- 20 lbs.         This is a list of Health Maintenance Items that are overdue or due now: Influenza vaccine Td vaccine Shingrix vaccine  Orders/Referrals Placed Today: No orders of the defined types were placed in this encounter.  (Contact our referral department at [phone] if you have not spoken with someone about your referral appointment within the next 5 days)    Follow-up Plan Follow-up with Agapito Games, MD as planned Schedule Shingles vaccine and td vaccine at the pharmacy. Medicare wellness visit in one year. AVS printed and given to the patient.        Health Maintenance,  Female Adopting a healthy lifestyle and getting preventive care are important in promoting health and wellness. Ask your health care provider about: The right schedule for you to have regular tests and exams. Things you can do on your own to prevent diseases and keep yourself healthy. What should I know about diet, weight, and exercise? Eat a healthy diet  Eat a diet that includes plenty of vegetables, fruits, low-fat dairy products, and lean protein. Do not eat a lot of foods that are high in solid fats, added sugars, or sodium. Maintain a healthy weight Body mass index (BMI) is used to identify weight problems. It estimates body fat based on height and weight. Your health care provider can help determine your BMI and help you achieve or maintain a healthy weight. Get regular exercise Get regular exercise. This is one of the most important things you can do for your health. Most adults should: Exercise for at least 150 minutes each week. The exercise should increase your heart rate and make you sweat (moderate-intensity exercise). Do strengthening exercises at least twice a week. This is in addition to the moderate-intensity exercise. Spend less time sitting. Even light physical activity can be beneficial. Watch cholesterol and blood lipids Have your blood tested for lipids and cholesterol at 67 years of age, then have this test every 5 years. Have your cholesterol levels checked more often if: Your lipid or cholesterol levels are high. You are older than 67 years of age. You are at high risk for  heart disease. What should I know about cancer screening? Depending on your health history and family history, you may need to have cancer screening at various ages. This may include screening for: Breast cancer. Cervical cancer. Colorectal cancer. Skin cancer. Lung cancer. What should I know about heart disease, diabetes, and high blood pressure? Blood pressure and heart disease High blood  pressure causes heart disease and increases the risk of stroke. This is more likely to develop in people who have high blood pressure readings or are overweight. Have your blood pressure checked: Every 3-5 years if you are 17-34 years of age. Every year if you are 13 years old or older. Diabetes Have regular diabetes screenings. This checks your fasting blood sugar level. Have the screening done: Once every three years after age 21 if you are at a normal weight and have a low risk for diabetes. More often and at a younger age if you are overweight or have a high risk for diabetes. What should I know about preventing infection? Hepatitis B If you have a higher risk for hepatitis B, you should be screened for this virus. Talk with your health care provider to find out if you are at risk for hepatitis B infection. Hepatitis C Testing is recommended for: Everyone born from [birth year] through 1966. Anyone with known risk factors for hepatitis C. Sexually transmitted infections (STIs) Get screened for STIs, including gonorrhea and chlamydia, if: You are sexually active and are younger than 67 years of age. You are older than 67 years of age and your health care provider tells you that you are at risk for this type of infection. Your sexual activity has changed since you were last screened, and you are at increased risk for chlamydia or gonorrhea. Ask your health care provider if you are at risk. Ask your health care provider about whether you are at high risk for HIV. Your health care provider may recommend a prescription medicine to help prevent HIV infection. If you choose to take medicine to prevent HIV, you should first get tested for HIV. You should then be tested every 3 months for as long as you are taking the medicine. Pregnancy If you are about to stop having your period (premenopausal) and you may become pregnant, seek counseling before you get pregnant. Take 400 to 800 micrograms (mcg) of folic  acid every day if you become pregnant. Ask for birth control (contraception) if you want to prevent pregnancy. Osteoporosis and menopause Osteoporosis is a disease in which the bones lose minerals and strength with aging. This can result in bone fractures. If you are 15 years old or older, or if you are at risk for osteoporosis and fractures, ask your health care provider if you should: Be screened for bone loss. Take a calcium or vitamin D supplement to lower your risk of fractures. Be given hormone replacement therapy (HRT) to treat symptoms of menopause. Follow these instructions at home: Alcohol use Do not drink alcohol if: Your health care provider tells you not to drink. You are pregnant, may be pregnant, or are planning to become pregnant. If you drink alcohol: Limit how much you have to: 0-1 drink a day. Know how much alcohol is in your drink. In the [place] one drink equals one 12 oz bottle of beer (355 mL), one 5 oz glass of wine (148 mL), or one 1 oz glass of hard liquor (44 mL). Lifestyle Do not use any products that contain nicotine or tobacco. These products  include cigarettes, chewing tobacco, and vaping devices, such as e-cigarettes. If you need help quitting, ask your health care provider. Do not use street drugs. Do not share needles. Ask your health care provider for help if you need support or information about quitting drugs. General instructions Schedule regular health, dental, and eye exams. Stay current with your vaccines. Tell your health care provider if: You often feel depressed. You have ever been abused or do not feel safe at home. Summary Adopting a healthy lifestyle and getting preventive care are important in promoting health and wellness. Follow your health care provider's instructions about healthy diet, exercising, and getting tested or screened for diseases. Follow your health care provider's instructions on monitoring your cholesterol and blood  pressure. This information is not intended to replace advice given to you by your health care provider. Make sure you discuss any questions you have with your health care provider. Document Revised: 09/09/2020 Document Reviewed: 09/09/2020 Elsevier Patient Education  2023 [employer].

## 2022-01-07 NOTE — Progress Notes (Signed)
MEDICARE ANNUAL WELLNESS VISIT  01/07/2022  Subjective:  Carolyn [name] is a 66 y.o. female patient of Metheney, Barbarann Ehlers, MD who had a Medicare Annual Wellness Visit today. Carolyn [name] is Retired and lives alone. Carolyn [name] has 4 children. Carolyn [name] reports that Carolyn [name] is socially active and does interact with friends/family regularly. Carolyn [name] is minimally physically active and enjoys sewing.  Patient Care Team: Carolyn Games, MD as PCP - General     01/07/2022   10:19 AM  Advanced Directives  Does Patient Have a Medical Advance Directive? Yes  Type of Advance Directive Living will  Does patient want to make changes to medical advance directive? No - Patient declined    Hospital Utilization Over the Past 12 Months: # of hospitalizations or ER visits: 0 # of surgeries: 0  Review of Systems    Patient reports that her overall health is unchanged when compared to last year.  Review of Systems: History obtained from chart review and the patient  All other systems negative.  Pain Assessment Pain : 0-10 Pain Score: 1  Pain Type: Acute pain Pain Location: Neck Pain Descriptors / Indicators: Aching Pain Onset: In the past 7 days Pain Frequency: Intermittent     Current Medications & Allergies (verified) Allergies as of 01/07/2022   No Known Allergies      Medication List        Accurate as of January 07, 2022 10:57 AM. If you have any questions, ask your nurse or doctor.          cyclobenzaprine 5 MG tablet Commonly known as: FLEXERIL TAKE 1 OR 2 TABLETS BY MOUTH DAILY AT BEDTIME AS NEEDED FOR MUSCLE SPASMS   cycloSPORINE 0.05 % ophthalmic emulsion Commonly known as: RESTASIS 1 drop 2 (two) times daily.   hydrOXYzine 25 MG tablet Commonly known as: ATARAX Take 1 tablet (25 mg total) by mouth 3 (three) times daily as needed for itching.   montelukast 10 MG tablet Commonly known as: SINGULAIR Take 1 tablet (10 mg total) by mouth at bedtime.   predniSONE 10 MG (21) Tbpk  tablet Commonly known as: STERAPRED UNI-PAK 21 TAB Taper as directed on packaging   rosuvastatin 10 MG tablet Commonly known as: Crestor Take 1 tablet (10 mg total) by mouth at bedtime.   VITAMIN C PO Take 1,000 mg by mouth daily.   Vitamin D (Ergocalciferol) 1.25 MG (50000 UNIT) Caps capsule Commonly known as: DRISDOL Take 1 capsule (50,000 Units total) by mouth every 7 (seven) days.   VITAMIN D-3 PO Take 5,000 Units by mouth daily.   ZINC PO Take by mouth. 22 mg/800 mg daily        History (reviewed): Past Medical History:  Diagnosis Date   Anemia    Anxiety    Arthritis, neck    Chest pain    Chronic pain of both feet    Depression    Fibromyalgia    Flat feet    High cholesterol    Joint pain    Knee pain, bilateral    Obesity    Osteoarthritis    neck   SOB (shortness of breath) on exertion    Vitamin D deficiency    Past Surgical History:  Procedure Laterality Date   TONSILLECTOMY  15   TONSILLECTOMY     TOTAL ABDOMINAL HYSTERECTOMY  10/90   has her left ovary   Family History  Problem Relation Age of Onset   Cancer Other  breast   Diabetes Father    Heart disease Father    Obesity Father    Heart attack Brother    Breast cancer Mother 49   Cancer Mother    Lung cancer Brother    Social History   Socioeconomic History   Marital status: Widowed    Spouse name: Not on file   Number of children: 4   Years of education: 79   Highest education level: 12th grade  Occupational History   Occupation: Retired  Tobacco Use   Smoking status: Never   Smokeless tobacco: Never  Vaping Use   Vaping Use: Never used  Substance and Sexual Activity   Alcohol use: No   Drug use: No   Sexual activity: Not on file  Other Topics Concern   Not on file  Social History Narrative   Lives alone. Carolyn [name] has four daughters, two daughters live in [place]. Carolyn [name] enjoys sewing.    Social Determinants of Health   Financial Resource Strain: Low Risk  (01/07/2022)    Overall Financial Resource Strain (CARDIA)    Difficulty of Paying Living Expenses: Not hard at all  Food Insecurity: No Food Insecurity (01/07/2022)   Hunger Vital Sign    Worried About Running Out of Food in the Last Year: Never true    Ran Out of Food in the Last Year: Never true  Transportation Needs: No Transportation Needs (01/07/2022)   PRAPARE - Administrator, Civil Service (Medical): No    Lack of Transportation (Non-Medical): No  Physical Activity: Inactive (01/07/2022)   Exercise Vital Sign    Days of Exercise per Week: 0 days    Minutes of Exercise per Session: 0 min  Stress: No Stress Concern Present (01/07/2022)   Carolyn [name] [employer] Occupational [employer] - Occupational Stress Questionnaire    Feeling of Stress : Not at all  Social Connections: Moderately Integrated (01/07/2022)   Social Connection and Isolation Panel [NHANES]    Frequency of Communication with Friends and Family: More than three times a week    Frequency of Social Gatherings with Friends and Family: More than three times a week    Attends Religious Services: More than 4 times per year    Active Member of [employer] or Organizations: Yes    Attends Banker Meetings: More than 4 times per year    Marital Status: Widowed    Activities of Daily Living    01/07/2022   10:30 AM  In your present state of health, do you have any difficulty performing the following activities:  Hearing? 0  Vision? 0  Difficulty concentrating or making decisions? 0  Walking or climbing stairs? 1  Comment Feet pain- when Carolyn [name] stands for long periods of time  Dressing or bathing? 0  Doing errands, shopping? 0  Preparing Food and eating ? N  Using the Toilet? N  In the past six months, have you accidently leaked urine? Y  Comment with coughing or sneezing  Do you have problems with loss of bowel control? N  Managing your Medications? N  Managing your Finances? N  Housekeeping or managing your Housekeeping? N     Patient Education/Literacy How often do you need to have someone help you when you read instructions, pamphlets, or other written materials from your doctor or pharmacy?: 1 - Never What is the last grade level you completed in school?: 12th grade  Exercise Current Exercise Habits: The patient does not participate in regular exercise at  present, Exercise limited by: None identified  Diet Patient reports consuming 2-3 meals a day and 0 snack(s) a day Patient reports that her primary diet is: Regular Patient reports that Carolyn [name] does have regular access to food.   Depression Screen    01/07/2022   10:22 AM 12/30/2020    2:50 PM 07/01/2020    2:02 PM 04/01/2020    9:27 AM 04/01/2020    8:09 AM 02/22/2020    9:56 AM 05/03/2019    8:51 AM  PHQ 2/9 Scores  PHQ - 2 Score 0 0 0 1 1 4 1   PHQ- 9 Score   1 4  14 9      Fall Risk    01/07/2022   10:21 AM 12/30/2020    2:49 PM 07/01/2020    2:02 PM 04/01/2020    8:09 AM 03/03/2019    3:12 PM  Fall Risk   Falls in the past year? 0 1 0 1 0  Number falls in past yr: 0 0  0 0  Injury with Fall? 0 0  0 0  Risk for fall due to : No Fall Risks  No Fall Risks History of fall(s)   Follow up Falls evaluation completed Falls evaluation completed  Falls evaluation completed      Objective:   BP 122/75 (BP Location: Right Arm, Patient Position: Sitting, Cuff Size: Large)   Pulse 66   Ht 5\' 5"  (1.651 m)   Wt 196 lb 1.3 oz (88.9 kg)   SpO2 100%   BMI 32.63 kg/m   Last Weight  Most recent update: 01/07/2022 10:13 AM    Weight  88.9 kg (196 lb 1.3 oz)             Body mass index is 32.63 kg/m.  Hearing/Vision  Carolyn [name] did not have difficulty with hearing/understanding during the face-to-face interview Carolyn [name] did not have difficulty with her vision during the face-to-face interview Reports that Carolyn [name] has not had a formal eye exam by an eye care professional within the past year Reports that Carolyn [name] has not had a formal hearing evaluation within the  past year  Cognitive Function:    01/07/2022   10:37 AM 12/30/2020    2:47 PM  6CIT Screen  What Year? 0 points 0 points  What month? 0 points 0 points  What time? 0 points 0 points  Count back from 20 0 points 0 points  Months in reverse 0 points 0 points  Repeat phrase 0 points 0 points  Total Score 0 points 0 points    Normal Cognitive Function Screening: Yes (Normal:0-7, Significant for Dysfunction: >8)  Immunization & Health Maintenance Record Immunization History  Administered Date(s) Administered   Fluad Quad(high Dose 65+) 04/01/2020, 12/30/2020   Influenza Split 03/11/2011   Influenza,inj,Quad PF,6+ Mos 02/03/2013, 03/14/2014, 01/18/2015, 01/30/2016, 01/29/2017, 01/31/2018, 03/03/2019   Janssen (J&J) SARS-COV-2 Vaccination 07/28/2019   PNEUMOCOCCAL CONJUGATE-20 12/30/2020   Td 08/28/2009    Health Maintenance  Topic Date Due   COVID-19 Vaccine (2 - Janssen risk series) 01/23/2022 (Originally 08/25/2019)   Zoster Vaccines- Shingrix (1 of 2) 04/08/2022 (Originally 03/16/1974)   INFLUENZA VACCINE  08/02/2022 (Originally 12/02/2021)   TETANUS/TDAP  01/08/2023 (Originally 08/29/2019)   MAMMOGRAM  08/01/2023   Fecal DNA (Cologuard)  01/18/2024   Pneumonia Vaccine 47+ Years old  Completed   DEXA SCAN  Completed   Hepatitis C Screening  Completed   HPV VACCINES  Aged Out       Assessment  This is a routine wellness examination for Carolyn [name].  Health Maintenance: Due or Overdue There are no preventive care reminders to display for this patient.   Carolyn [name] does not need a referral for Carolyn [name] Assistance: Care Management:   no Social Work:    no Prescription Assistance:  no Nutrition/Diabetes Education:  no   Plan:  Personalized Goals  Goals Addressed               This Visit's Progress     Patient Stated (pt-stated)        Would like to loose weight- 20 lbs.       Personalized Health Maintenance & Screening Recommendations  Influenza  vaccine Td vaccine Shingrix vaccine  Lung Cancer Screening Recommended: no (Low Dose CT Chest recommended if Age 62-80 years, 30 pack-year currently smoking OR have quit w/in past 15 years) Hepatitis C Screening recommended: no HIV Screening recommended: no  Advanced Directives: Written information was not given per the patient's request.  Referrals & Orders No orders of the defined types were placed in this encounter.   Follow-up Plan Follow-up with Carolyn Games, MD as planned Schedule Shingles vaccine and td vaccine at the pharmacy. Medicare wellness visit in one year. AVS printed and given to the patient.   I have personally reviewed and noted the following in the patient's chart:   Medical and social history Use of alcohol, tobacco or illicit drugs  Current medications and supplements Functional ability and status Nutritional status Physical activity Advanced directives List of other physicians Hospitalizations, surgeries, and ER visits in previous 12 months Vitals Screenings to include cognitive, depression, and falls Referrals and appointments  In addition, I have reviewed and discussed with patient certain preventive protocols, quality metrics, and best practice recommendations. A written personalized care plan for preventive services as well as general preventive health recommendations were provided to patient.     Modesto Charon, RN BSN  01/07/2022

## 2022-02-05 ENCOUNTER — Encounter: Payer: Medicare Other | Admitting: Family Medicine

## 2022-08-18 DIAGNOSIS — K080 Exfoliation of teeth due to systemic causes: Secondary | ICD-10-CM | POA: Diagnosis not present

## 2022-12-15 DIAGNOSIS — K080 Exfoliation of teeth due to systemic causes: Secondary | ICD-10-CM | POA: Diagnosis not present

## 2023-03-25 ENCOUNTER — Encounter: Payer: Self-pay | Admitting: Family Medicine

## 2023-03-25 DIAGNOSIS — W19XXXA Unspecified fall, initial encounter: Secondary | ICD-10-CM | POA: Diagnosis not present

## 2023-03-25 DIAGNOSIS — S92502A Displaced unspecified fracture of left lesser toe(s), initial encounter for closed fracture: Secondary | ICD-10-CM | POA: Diagnosis not present

## 2023-03-25 DIAGNOSIS — S82892A Other fracture of left lower leg, initial encounter for closed fracture: Secondary | ICD-10-CM | POA: Diagnosis not present

## 2023-03-25 DIAGNOSIS — M25572 Pain in left ankle and joints of left foot: Secondary | ICD-10-CM | POA: Diagnosis not present

## 2023-03-25 DIAGNOSIS — M79672 Pain in left foot: Secondary | ICD-10-CM | POA: Diagnosis not present

## 2023-03-26 DIAGNOSIS — S92502A Displaced unspecified fracture of left lesser toe(s), initial encounter for closed fracture: Secondary | ICD-10-CM | POA: Diagnosis not present

## 2023-03-26 DIAGNOSIS — S82832A Other fracture of upper and lower end of left fibula, initial encounter for closed fracture: Secondary | ICD-10-CM | POA: Diagnosis not present

## 2023-04-14 DIAGNOSIS — S82892A Other fracture of left lower leg, initial encounter for closed fracture: Secondary | ICD-10-CM | POA: Diagnosis not present

## 2023-04-14 DIAGNOSIS — S92515A Nondisplaced fracture of proximal phalanx of left lesser toe(s), initial encounter for closed fracture: Secondary | ICD-10-CM | POA: Diagnosis not present

## 2023-04-29 DIAGNOSIS — M25372 Other instability, left ankle: Secondary | ICD-10-CM | POA: Diagnosis not present

## 2023-04-29 DIAGNOSIS — S92502A Displaced unspecified fracture of left lesser toe(s), initial encounter for closed fracture: Secondary | ICD-10-CM | POA: Diagnosis not present

## 2023-04-29 DIAGNOSIS — M25371 Other instability, right ankle: Secondary | ICD-10-CM | POA: Diagnosis not present

## 2023-04-29 DIAGNOSIS — S82832A Other fracture of upper and lower end of left fibula, initial encounter for closed fracture: Secondary | ICD-10-CM | POA: Diagnosis not present

## 2023-05-28 ENCOUNTER — Telehealth: Payer: Self-pay

## 2023-05-28 DIAGNOSIS — S92515A Nondisplaced fracture of proximal phalanx of left lesser toe(s), initial encounter for closed fracture: Secondary | ICD-10-CM | POA: Insufficient documentation

## 2023-05-28 DIAGNOSIS — S82832A Other fracture of upper and lower end of left fibula, initial encounter for closed fracture: Secondary | ICD-10-CM

## 2023-05-28 DIAGNOSIS — M8580 Other specified disorders of bone density and structure, unspecified site: Secondary | ICD-10-CM

## 2023-05-28 HISTORY — DX: Other fracture of upper and lower end of left fibula, initial encounter for closed fracture: S82.832A

## 2023-05-28 NOTE — Telephone Encounter (Signed)
Copied from CRM [id number]. Topic: General - Other >> May 27, 2023  3:32 PM Nila Nephew wrote: Reason for CRM: Payer pharmacy calling to obtain information like PCP being seen, upcoming appointment, and  Fax number. Payer is requesting a call back regarding bone density screenings at [phone] ext [phone] for [facility].

## 2023-05-28 NOTE — Telephone Encounter (Signed)
[facility] pt recently had a fx on 03/25/2023.  Since she had suffered a fx she will need a bone Dx done 5/20/204 (6 months after fx) this is a Medicare standard.

## 2023-05-31 NOTE — Telephone Encounter (Signed)
Call patient and let her know that we need to get an up-to-date bone density because of her recent fracture.  The order has been placed so she just needs to have it scheduled we can give her the phone number to call imaging.

## 2023-06-01 ENCOUNTER — Other Ambulatory Visit: Payer: Self-pay | Admitting: Family Medicine

## 2023-06-01 DIAGNOSIS — Z1231 Encounter for screening mammogram for malignant neoplasm of breast: Secondary | ICD-10-CM

## 2023-06-01 NOTE — Telephone Encounter (Signed)
Patient informed and given number to radiology to schedule.

## 2023-06-02 DIAGNOSIS — K080 Exfoliation of teeth due to systemic causes: Secondary | ICD-10-CM | POA: Diagnosis not present

## 2023-06-07 ENCOUNTER — Telehealth: Payer: Self-pay | Admitting: Family Medicine

## 2023-06-07 ENCOUNTER — Encounter: Payer: Self-pay | Admitting: Family Medicine

## 2023-06-07 DIAGNOSIS — M8580 Other specified disorders of bone density and structure, unspecified site: Secondary | ICD-10-CM

## 2023-06-07 DIAGNOSIS — R7303 Prediabetes: Secondary | ICD-10-CM

## 2023-06-07 DIAGNOSIS — E559 Vitamin D deficiency, unspecified: Secondary | ICD-10-CM

## 2023-06-07 DIAGNOSIS — E7849 Other hyperlipidemia: Secondary | ICD-10-CM

## 2023-06-07 NOTE — Telephone Encounter (Signed)
Orders Placed This Encounter  Procedures   CMP14+EGFR   Lipid panel   CBC   VITAMIN D 25 Hydroxy (Vit-D Deficiency, Fractures)   Hemoglobin A1c

## 2023-06-08 LAB — CBC
Hematocrit: 40.1 % (ref 34.0–46.6)
Hemoglobin: 12.9 g/dL (ref 11.1–15.9)
MCH: 29 pg (ref 26.6–33.0)
MCHC: 32.2 g/dL (ref 31.5–35.7)
MCV: 90 fL (ref 79–97)
Platelets: 381 10*3/uL (ref 150–450)
RBC: 4.45 x10E6/uL (ref 3.77–5.28)
RDW: 13.1 % (ref 11.7–15.4)
WBC: 6.8 10*3/uL (ref 3.4–10.8)

## 2023-06-08 LAB — LIPID PANEL
Chol/HDL Ratio: 4.6 {ratio} — ABNORMAL HIGH (ref 0.0–4.4)
Cholesterol, Total: 259 mg/dL — ABNORMAL HIGH (ref 100–199)
HDL: 56 mg/dL (ref >39)
LDL Chol Calc (NIH): 161 mg/dL — ABNORMAL HIGH (ref 0–99)
Triglycerides: 230 mg/dL — ABNORMAL HIGH (ref 0–149)
VLDL Cholesterol Cal: 42 mg/dL — ABNORMAL HIGH (ref 5–40)

## 2023-06-08 LAB — CMP14+EGFR
ALT: 15 [IU]/L (ref 0–32)
AST: 19 [IU]/L (ref 0–40)
Albumin: 4.4 g/dL (ref 3.9–4.9)
Alkaline Phosphatase: 77 [IU]/L (ref 44–121)
BUN/Creatinine Ratio: 17 (ref 12–28)
BUN: 15 mg/dL (ref 8–27)
Bilirubin Total: 0.4 mg/dL (ref 0.0–1.2)
CO2: 19 mmol/L — ABNORMAL LOW (ref 20–29)
Calcium: 9.6 mg/dL (ref 8.7–10.3)
Chloride: 105 mmol/L (ref 96–106)
Creatinine, Ser: 0.87 mg/dL (ref 0.57–1.00)
Globulin, Total: 2.4 g/dL (ref 1.5–4.5)
Glucose: 103 mg/dL — ABNORMAL HIGH (ref 70–99)
Potassium: 4.8 mmol/L (ref 3.5–5.2)
Sodium: 140 mmol/L (ref 134–144)
Total Protein: 6.8 g/dL (ref 6.0–8.5)
eGFR: 73 mL/min/{1.73_m2}

## 2023-06-08 LAB — HEMOGLOBIN A1C
Est. average glucose Bld gHb Est-mCnc: 123 mg/dL
Hgb A1c MFr Bld: 5.9 % — ABNORMAL HIGH (ref 4.8–5.6)

## 2023-06-08 LAB — VITAMIN D 25 HYDROXY (VIT D DEFICIENCY, FRACTURES): Vit D, 25-Hydroxy: 38.7 ng/mL (ref 30.0–100.0)

## 2023-06-09 ENCOUNTER — Encounter: Payer: Self-pay | Admitting: Family Medicine

## 2023-06-09 NOTE — Progress Notes (Signed)
 Hi Aikam, metabolic panel overall looks good.  LDL cholesterol jumped up significantly.  It was under 100 the last time we checked it and this time it was 161.  This puts your 10-year cardiovascular risk score around 7.6%.  That means that you have approximately a 7 and half percent risk of developing cardiovascular disease in the next 10 years.  He can talk more about it when I see you.  A1c is still in the prediabetes range.  MND on the low end of normal.  The 10-year ASCVD risk score (Arnett DK, et al., 2019) is: 7.6%   Values used to calculate the score:     Age: 68 years     Sex: Female     Is Non-Hispanic African American: No     Diabetic: No     Tobacco smoker: No     Systolic Blood Pressure: 121 mmHg     Is BP treated: No     HDL Cholesterol: 56 mg/dL     Total Cholesterol: 259 mg/dL

## 2023-06-15 ENCOUNTER — Ambulatory Visit (INDEPENDENT_AMBULATORY_CARE_PROVIDER_SITE_OTHER): Payer: Medicare Other | Admitting: Family Medicine

## 2023-06-15 ENCOUNTER — Encounter: Payer: Self-pay | Admitting: Family Medicine

## 2023-06-15 ENCOUNTER — Ambulatory Visit (INDEPENDENT_AMBULATORY_CARE_PROVIDER_SITE_OTHER): Payer: Medicare Other

## 2023-06-15 VITALS — BP 127/70 | HR 72 | Ht 65.0 in | Wt 214.0 lb

## 2023-06-15 VITALS — Ht 65.0 in | Wt 207.0 lb

## 2023-06-15 DIAGNOSIS — Z0000 Encounter for general adult medical examination without abnormal findings: Secondary | ICD-10-CM | POA: Diagnosis not present

## 2023-06-15 DIAGNOSIS — E782 Mixed hyperlipidemia: Secondary | ICD-10-CM

## 2023-06-15 DIAGNOSIS — Z23 Encounter for immunization: Secondary | ICD-10-CM

## 2023-06-15 DIAGNOSIS — M25552 Pain in left hip: Secondary | ICD-10-CM

## 2023-06-15 DIAGNOSIS — E7849 Other hyperlipidemia: Secondary | ICD-10-CM

## 2023-06-15 MED ORDER — ROSUVASTATIN CALCIUM 10 MG PO TABS: 10.0000 mg | ORAL_TABLET | Freq: Every day | ORAL | 3 refills | Status: AC

## 2023-06-15 NOTE — Progress Notes (Signed)
Complete physical exam  Patient: Carolyn [name]   DOB: [birth year]   68 y.o. Female  MRN: [id number]  Subjective:    Chief Complaint  Patient presents with   Annual Exam    Carolyn [name] is a 68 y.o. female who presents today for a complete physical exam. She reports consuming a general diet. The patient does not participate in regular exercise at present. She generally feels well. . She does have additional problems to discuss today.  She did have her labs drawn.   She says her mammo and dEXa are scheduled.  She had a recent fall down the stairs and injured her left foot she has been following with podiatry.  She still in a brace.  She had a fracture of the distal end of the left fibula.  She fell down multiple stairs so does not feel like it is a low impact injury.  Also feels like since the fall down the stairs that her left leg is more difficult to lift.  She notices if she lays in bed and tries to lift that leg it is incredibly hard and painful to do so.  When she fell down the stairs her leg went forward and her left leg stayed planted and she almost did a split down the stairs.  She is taking her calcium and vitamin D supplement.  Most recent fall risk assessment:    06/15/2023    9:42 AM  Fall Risk   Falls in the past year? 1  Number falls in past yr: 1  Injury with Fall? 1  Risk for fall due to : Other (Comment)  Risk for fall due to: Comment slipped down stairs  Follow up Falls evaluation completed     Most recent depression screenings:    06/15/2023    9:38 AM 01/07/2022   10:22 AM  PHQ 2/9 Scores  PHQ - 2 Score 0 0         Patient Care Team: Agapito Games, MD as PCP - General Mothershed, Jolyn Nap., DPM (Podiatry)   Outpatient Medications Prior to Visit  Medication Sig   Ascorbic Acid (VITAMIN C PO) Take 1,000 mg by mouth daily.   calcium carbonate (OS-CAL - DOSED IN MG OF ELEMENTAL CALCIUM) 1250 (500 Ca) MG tablet Take 1 tablet by mouth.    Cholecalciferol (VITAMIN D-3 PO) Take 5,000 Units by mouth daily.   cyclobenzaprine (FLEXERIL) 5 MG tablet TAKE 1 OR 2 TABLETS BY MOUTH DAILY AT BEDTIME AS NEEDED FOR MUSCLE SPASMS   cycloSPORINE (RESTASIS) 0.05 % ophthalmic emulsion 1 drop 2 (two) times daily.   hydrOXYzine (ATARAX/VISTARIL) 25 MG tablet Take 1 tablet (25 mg total) by mouth 3 (three) times daily as needed for itching.   montelukast (SINGULAIR) 10 MG tablet Take 1 tablet (10 mg total) by mouth at bedtime.   Multiple Vitamins-Minerals (ZINC PO) Take by mouth. 22 mg/800 mg daily   [DISCONTINUED] rosuvastatin (CRESTOR) 10 MG tablet Take 1 tablet (10 mg total) by mouth at bedtime.   No facility-administered medications prior to visit.    ROS        Objective:     BP 127/70   Pulse 72   Ht 5\' 5"  (1.651 m)   Wt 214 lb (97.1 kg)   SpO2 99%   BMI 35.61 kg/m     Physical Exam Constitutional:      Appearance: Normal appearance.  HENT:     Head: Normocephalic and atraumatic.  Right Ear: Tympanic membrane, ear canal and external ear normal.     Left Ear: Tympanic membrane, ear canal and external ear normal.     Nose: Nose normal.     Mouth/Throat:     Pharynx: Oropharynx is clear.  Eyes:     Extraocular Movements: Extraocular movements intact.     Conjunctiva/sclera: Conjunctivae normal.     Pupils: Pupils are equal, round, and reactive to light.  Neck:     Thyroid: No thyromegaly.  Cardiovascular:     Rate and Rhythm: Normal rate and regular rhythm.  Pulmonary:     Effort: Pulmonary effort is normal.     Breath sounds: Normal breath sounds.  Abdominal:     General: Bowel sounds are normal.     Palpations: Abdomen is soft.     Tenderness: There is no abdominal tenderness.  Musculoskeletal:        General: No swelling.     Cervical back: Neck supple.  Skin:    General: Skin is warm and dry.  Neurological:     Mental Status: She is oriented to person, place, and time.  Psychiatric:        Mood and  Affect: Mood normal.        Behavior: Behavior normal.      No results found for any visits on 06/15/23.     Assessment & Plan:    Routine Health Maintenance and Physical Exam  Immunization History  Administered Date(s) Administered   Fluad Quad(high Dose 65+) 04/01/2020, 12/30/2020   Fluad Trivalent(High Dose 65+) 06/15/2023   Influenza Split 03/11/2011   Influenza,inj,Quad PF,6+ Mos 02/03/2013, 03/14/2014, 01/18/2015, 01/30/2016, 01/29/2017, 01/31/2018, 03/03/2019   Janssen (J&J) SARS-COV-2 Vaccination 07/28/2019   PNEUMOCOCCAL CONJUGATE-20 12/30/2020   Td 08/28/2009   Zoster Recombinant(Shingrix) 10/16/2022, 02/26/2023    Health Maintenance  Topic Date Due   DTaP/Tdap/Td (2 - Tdap) 08/29/2019   COVID-19 Vaccine (2 - Janssen risk series) 11/05/2023 (Originally 08/25/2019)   MAMMOGRAM  08/01/2023   Fecal DNA (Cologuard)  01/18/2024   Medicare Annual Wellness (AWV)  06/14/2024   Pneumonia Vaccine 68+ Years old  Completed   INFLUENZA VACCINE  Completed   DEXA SCAN  Completed   Hepatitis C Screening  Completed   Zoster Vaccines- Shingrix  Completed   HPV VACCINES  Aged Out    Discussed health benefits of physical activity, and encouraged her to engage in regular exercise appropriate for her age and condition.  Problem List Items Addressed This Visit       Other   Other hyperlipidemia   Relevant Medications   rosuvastatin (CRESTOR) 10 MG tablet   Mixed hyperlipidemia - Primary   LDL had jumped up significantly but she had not been taking her rosuvastatin.  Will send updated prescription to the pharmacy and she plans to restart it.      Relevant Medications   rosuvastatin (CRESTOR) 10 MG tablet   Other Visit Diagnoses       Encounter for immunization       Relevant Orders   Flu Vaccine Trivalent High Dose (Fluad) (Completed)       Keep up a regular exercise program and make sure you are eating a healthy diet Try to eat 4 servings of dairy a day, or if you  are lactose intolerant take a calcium with vitamin D daily.  Your vaccines are up to date.   Left hip pain I do think she has a left hip flexor strain so gave her  some exercises to do on her own at home.  If not improving over the next 3 weeks then please let us know we will consider sports med referral and or formal physical therapy.  Return in about 1 year (around 06/14/2024) for Wellness Exam.     Nani Gasser, MD

## 2023-06-15 NOTE — Assessment & Plan Note (Addendum)
LDL had jumped up significantly but she had not been taking her rosuvastatin.  Will send updated prescription to the pharmacy and she plans to restart it.

## 2023-06-15 NOTE — Patient Instructions (Signed)
See exercises for hip flexor.

## 2023-06-15 NOTE — Patient Instructions (Signed)
  Carolyn [name] , Thank you for taking time to come for your Medicare Wellness Visit. I appreciate your ongoing commitment to your health goals. Please review the following plan we discussed and let me know if I can assist you in the future.   These are the goals we discussed:  Goals       Patient Stated (pt-stated)      Would like to loose weight- 20 lbs.      Weight (lb) < 200 lb (90.7 kg)      She would like to lose weight this year.         This is a list of the screening recommended for you and due dates:  Health Maintenance  Topic Date Due   DTaP/Tdap/Td vaccine (2 - Tdap) 08/29/2019   Flu Shot  12/03/2022   COVID-19 Vaccine (2 - Janssen risk series) 11/05/2023*   Mammogram  08/01/2023   Cologuard (Stool DNA test)  01/18/2024   Medicare Annual Wellness Visit  06/14/2024   Pneumonia Vaccine  Completed   DEXA scan (bone density measurement)  Completed   Hepatitis C Screening  Completed   Zoster (Shingles) Vaccine  Completed   HPV Vaccine  Aged Out  *Topic was postponed. The date shown is not the original due date.

## 2023-06-15 NOTE — Progress Notes (Signed)
Subjective:   Carolyn [name] is a 68 y.o. female who presents for Medicare Annual (Subsequent) preventive examination.  Visit Complete: Virtual I connected with  Carolyn [name] on 06/15/23 by a audio enabled telemedicine application and verified that I am speaking with the correct person using two identifiers.  Patient Location: Home  Provider Location: Office/Clinic  I discussed the limitations of evaluation and management by telemedicine. The patient expressed understanding and agreed to proceed.  Vital Signs: Because this visit was a virtual/telehealth visit, some criteria may be missing or patient reported. Any vitals not documented were not able to be obtained and vitals that have been documented are patient reported.  Patient Medicare AWV questionnaire was completed by the patient on 06/02/2023; I have confirmed that all information answered by patient is correct and no changes since this date.  Cardiac Risk Factors include: advanced age (>31men, >53 women);dyslipidemia;obesity (BMI >30kg/m2)     Objective:    Today's Vitals   06/15/23 0913  Weight: 207 lb (93.9 kg)  Height: 5\' 5"  (1.651 m)   Body mass index is 34.45 kg/m.     06/15/2023    9:41 AM 01/07/2022   10:19 AM  Advanced Directives  Does Patient Have a Medical Advance Directive? Yes Yes  Type of Estate agent of [place];Living will Living will  Does patient want to make changes to medical advance directive?  No - Patient declined  Copy of Healthcare Power of Attorney in Chart? No - copy requested     Current Medications (verified) Outpatient Encounter Medications as of 06/15/2023  Medication Sig   calcium carbonate (OS-CAL - DOSED IN MG OF ELEMENTAL CALCIUM) 1250 (500 Ca) MG tablet Take 1 tablet by mouth.   Cholecalciferol (VITAMIN D-3 PO) Take 5,000 Units by mouth daily.   cycloSPORINE (RESTASIS) 0.05 % ophthalmic emulsion 1 drop 2 (two) times daily.   Ascorbic Acid (VITAMIN C PO) Take  1,000 mg by mouth daily. (Patient not taking: Reported on 06/15/2023)   cyclobenzaprine (FLEXERIL) 5 MG tablet TAKE 1 OR 2 TABLETS BY MOUTH DAILY AT BEDTIME AS NEEDED FOR MUSCLE SPASMS (Patient not taking: Reported on 06/15/2023)   hydrOXYzine (ATARAX/VISTARIL) 25 MG tablet Take 1 tablet (25 mg total) by mouth 3 (three) times daily as needed for itching. (Patient not taking: Reported on 06/15/2023)   montelukast (SINGULAIR) 10 MG tablet Take 1 tablet (10 mg total) by mouth at bedtime. (Patient not taking: Reported on 06/15/2023)   Multiple Vitamins-Minerals (ZINC PO) Take by mouth. 22 mg/800 mg daily (Patient not taking: Reported on 06/15/2023)   rosuvastatin (CRESTOR) 10 MG tablet Take 1 tablet (10 mg total) by mouth at bedtime. (Patient not taking: Reported on 06/15/2023)   [DISCONTINUED] Vitamin D, Ergocalciferol, (DRISDOL) 1.25 MG (50000 UNIT) CAPS capsule Take 1 capsule (50,000 Units total) by mouth every 7 (seven) days. (Patient not taking: Reported on 01/07/2022)   No facility-administered encounter medications on file as of 06/15/2023.    Allergies (verified) Patient has no known allergies.   History: Past Medical History:  Diagnosis Date   Anemia    Anxiety    Arthritis, neck    Chest pain    Chronic pain of both feet    Closed fracture of distal end of left fibula 05/28/2023   Depression    Fibromyalgia    Flat feet    High cholesterol    Joint pain    Knee pain, bilateral    Obesity    Osteoarthritis  neck   SOB (shortness of breath) on exertion    Vitamin D deficiency    Past Surgical History:  Procedure Laterality Date   TONSILLECTOMY  15   TONSILLECTOMY     TOTAL ABDOMINAL HYSTERECTOMY  10/90   has her left ovary   Family History  Problem Relation Age of Onset   Breast cancer Mother 53   Cancer Mother    Diabetes Father    Heart disease Father    Obesity Father    Heart attack Brother    Lung cancer Brother    Cancer Other        breast   Social History    Socioeconomic History   Marital status: Widowed    Spouse name: Not on file   Number of children: 4   Years of education: 17   Highest education level: 12th grade  Occupational History   Occupation: Retired  Tobacco Use   Smoking status: Never   Smokeless tobacco: Never  Vaping Use   Vaping status: Never Used  Substance and Sexual Activity   Alcohol use: No   Drug use: No   Sexual activity: Not on file  Other Topics Concern   Not on file  Social History Narrative   Lives alone. She has four daughters, two daughters live in [place]. She enjoys sewing.    Social Drivers of Corporate investment banker Strain: Low Risk  (06/15/2023)   Overall Financial Resource Strain (CARDIA)    Difficulty of Paying Living Expenses: Not hard at all  Food Insecurity: No Food Insecurity (06/15/2023)   Hunger Vital Sign    Worried About Running Out of Food in the Last Year: Never true    Ran Out of Food in the Last Year: Never true  Transportation Needs: No Transportation Needs (06/15/2023)   PRAPARE - Administrator, Civil Service (Medical): No    Lack of Transportation (Non-Medical): No  Physical Activity: Inactive (06/15/2023)   Exercise Vital Sign    Days of Exercise per Week: 0 days    Minutes of Exercise per Session: 0 min  Stress: No Stress Concern Present (06/15/2023)   [employer] of Occupational Health - Occupational Stress Questionnaire    Feeling of Stress : Not at all  Social Connections: Moderately Integrated (06/15/2023)   Social Connection and Isolation Panel [NHANES]    Frequency of Communication with Friends and Family: More than three times a week    Frequency of Social Gatherings with Friends and Family: More than three times a week    Attends Religious Services: More than 4 times per year    Active Member of [employer] or Organizations: Yes    Attends Banker Meetings: More than 4 times per year    Marital Status: Widowed    Tobacco  Counseling Counseling given: Not Answered   Clinical Intake:  Pre-visit preparation completed: Yes  Pain : No/denies pain     BMI - recorded: 34.45 Nutritional Status: BMI > 30  Obese Nutritional Risks: None Diabetes: No  How often do you need to have someone help you when you read instructions, pamphlets, or other written materials from your doctor or pharmacy?: 1 - Never What is the last grade level you completed in school?: 12  Interpreter Needed?: No      Activities of Daily Living    06/15/2023    9:22 AM  In your present state of health, do you have any difficulty performing the following  activities:  Hearing? 0  Vision? 0  Difficulty concentrating or making decisions? 0  Walking or climbing stairs? 1  Comment current ankle injury  Dressing or bathing? 0  Doing errands, shopping? 0  Preparing Food and eating ? N  Using the Toilet? N  In the past six months, have you accidently leaked urine? Y  Do you have problems with loss of bowel control? N  Managing your Medications? N  Managing your Finances? N  Housekeeping or managing your Housekeeping? N    Patient Care Team: Agapito Games, MD as PCP - General Mothershed, Jolyn Nap., DPM (Podiatry)  Indicate any recent Medical Services you may have received from other than [facility] providers in the past year (date may be approximate).     Assessment:   This is a routine wellness examination for Ryana.  Hearing/Vision screen Hearing Screening - Comments:: Unable to test Vision Screening - Comments:: Unable to test   Goals Addressed             This Visit's Progress    Weight (lb) < 200 lb (90.7 kg)       She would like to lose weight this year.       Depression Screen    06/15/2023    9:38 AM 01/07/2022   10:22 AM 12/30/2020    2:50 PM 07/01/2020    2:02 PM 04/01/2020    9:27 AM 04/01/2020    8:09 AM 02/22/2020    9:56 AM  PHQ 2/9 Scores  PHQ - 2 Score 0 0 0 0 1 1 4   PHQ- 9 Score    1 4  14      Fall Risk    06/15/2023    9:42 AM 01/07/2022   10:21 AM 12/30/2020    2:49 PM 07/01/2020    2:02 PM 04/01/2020    8:09 AM  Fall Risk   Falls in the past year? 1 0 1 0 1  Number falls in past yr: 1 0 0  0  Injury with Fall? 1 0 0  0  Risk for fall due to : Other (Comment) No Fall Risks  No Fall Risks History of fall(s)  Risk for fall due to: Comment slipped down stairs      Follow up Falls evaluation completed Falls evaluation completed Falls evaluation completed  Falls evaluation completed    MEDICARE RISK AT HOME: Medicare Risk at Home Any stairs in or around the home?: No If so, are there any without handrails?: No Home free of loose throw rugs in walkways, pet beds, electrical cords, etc?: Yes Adequate lighting in your home to reduce risk of falls?: Yes Life alert?: No Use of a cane, walker or w/c?: Yes Grab bars in the bathroom?: Yes Shower chair or bench in shower?: Yes Elevated toilet seat or a handicapped toilet?: Yes  TIMED UP AND GO:  Was the test performed?  No    Cognitive Function:        06/15/2023    9:44 AM 01/07/2022   10:37 AM 12/30/2020    2:47 PM  6CIT Screen  What Year? 0 points 0 points 0 points  What month? 0 points 0 points 0 points  What time? 0 points 0 points 0 points  Count back from 20 0 points 0 points 0 points  Months in reverse 0 points 0 points 0 points  Repeat phrase 0 points 0 points 0 points  Total Score 0 points 0 points 0 points  Immunizations Immunization History  Administered Date(s) Administered   Fluad Quad(high Dose 65+) 04/01/2020, 12/30/2020   Influenza Split 03/11/2011   Influenza,inj,Quad PF,6+ Mos 02/03/2013, 03/14/2014, 01/18/2015, 01/30/2016, 01/29/2017, 01/31/2018, 03/03/2019   Janssen (J&J) SARS-COV-2 Vaccination 07/28/2019   PNEUMOCOCCAL CONJUGATE-20 12/30/2020   Td 08/28/2009   Zoster Recombinant(Shingrix) 10/16/2022, 02/26/2023    TDAP status: Due, Education has been provided regarding the importance  of this vaccine. Advised may receive this vaccine at local pharmacy or Health Dept. Aware to provide a copy of the vaccination record if obtained from local pharmacy or Health Dept. Verbalized acceptance and understanding.  Flu Vaccine status: Due, Education has been provided regarding the importance of this vaccine. Advised may receive this vaccine at local pharmacy or Health Dept. Aware to provide a copy of the vaccination record if obtained from local pharmacy or Health Dept. Verbalized acceptance and understanding.  Pneumococcal vaccine status: Up to date  Covid-19 vaccine status: Declined, Education has been provided regarding the importance of this vaccine but patient still declined. Advised may receive this vaccine at local pharmacy or Health Dept.or vaccine clinic. Aware to provide a copy of the vaccination record if obtained from local pharmacy or Health Dept. Verbalized acceptance and understanding.  Qualifies for Shingles Vaccine? Yes   Zostavax completed No   Shingrix Completed?: Yes  Screening Tests Health Maintenance  Topic Date Due   DTaP/Tdap/Td (2 - Tdap) 08/29/2019   INFLUENZA VACCINE  12/03/2022   COVID-19 Vaccine (2 - Janssen risk series) 11/05/2023 (Originally 08/25/2019)   MAMMOGRAM  08/01/2023   Fecal DNA (Cologuard)  01/18/2024   Medicare Annual Wellness (AWV)  06/14/2024   Pneumonia Vaccine 93+ Years old  Completed   DEXA SCAN  Completed   Hepatitis C Screening  Completed   Zoster Vaccines- Shingrix  Completed   HPV VACCINES  Aged Out    Health Maintenance  Health Maintenance Due  Topic Date Due   DTaP/Tdap/Td (2 - Tdap) 08/29/2019   INFLUENZA VACCINE  12/03/2022    Colorectal cancer screening: Type of screening: Cologuard. Completed 01/17/2021. Repeat every 3 years  Mammogram status: Ordered Appointment date 07/21/2023. Pt provided with contact info and advised to call to schedule appt.   Bone Density status: Ordered Appointment date 07/21/2023. Pt  provided with contact info and advised to call to schedule appt.  Lung Cancer Screening: (Low Dose CT Chest recommended if Age 37-80 years, 20 pack-year currently smoking OR have quit w/in 15years.) does not qualify.   Lung Cancer Screening Referral: nla  Additional Screening:  Hepatitis C Screening: does qualify; Completed 10/17/2015  Vision Screening: Recommended annual ophthalmology exams for early detection of glaucoma and other disorders of the eye. Is the patient up to date with their annual eye exam?  No  Who is the provider or what is the name of the office in which the patient attends annual eye exams? [facility] If pt is not established with a provider, would they like to be referred to a provider to establish care? No .   Dental Screening: Recommended annual dental exams for proper oral hygiene   Community Resource Referral / Chronic Care Management: CRR required this visit?  No   CCM required this visit?  No     Plan:     I have personally reviewed and noted the following in the patient's chart:   Medical and social history Use of alcohol, tobacco or illicit drugs  Current medications and supplements including opioid prescriptions. Patient is not currently  taking opioid prescriptions. Functional ability and status Nutritional status Physical activity Advanced directives List of other physicians Hospitalizations, surgeries, and ER visits in previous 12 months Vitals Screenings to include cognitive, depression, and falls Referrals and appointments  In addition, I have reviewed and discussed with patient certain preventive protocols, quality metrics, and best practice recommendations. A written personalized care plan for preventive services as well as general preventive health recommendations were provided to patient.     Esmond Harps, CMA   06/15/2023   After Visit Summary: (MyChart) Due to this being a telephonic visit, the after visit summary  with patients personalized plan was offered to patient via MyChart   Nurse Notes:   Carolyn [name] is a 68 y.o. female patient of Nani Gasser, MD who had a Medicare Annual Wellness Visit today via telephone. She is less active now due to an ankle injury after a fall down the stairs at her daughters house. She plans to ask the podiatrist about being more active after she heals from the injury.  She loves sewing.   Scheduled for Dexa Scan and Mammogram 07/21/2023  Recommend T-dap at local pharmacy.  Recommend Flu vaccine today during an appointment with Dr Linford Arnold.   Advised she will need another Cologuard this year in September.

## 2023-07-01 DIAGNOSIS — S82832D Other fracture of upper and lower end of left fibula, subsequent encounter for closed fracture with routine healing: Secondary | ICD-10-CM | POA: Diagnosis not present

## 2023-07-01 DIAGNOSIS — Z1330 Encounter for screening examination for mental health and behavioral disorders, unspecified: Secondary | ICD-10-CM | POA: Diagnosis not present

## 2023-07-01 DIAGNOSIS — S92502D Displaced unspecified fracture of left lesser toe(s), subsequent encounter for fracture with routine healing: Secondary | ICD-10-CM | POA: Diagnosis not present

## 2023-07-21 ENCOUNTER — Ambulatory Visit: Payer: Medicare Other

## 2023-07-21 DIAGNOSIS — M85851 Other specified disorders of bone density and structure, right thigh: Secondary | ICD-10-CM | POA: Diagnosis not present

## 2023-07-21 DIAGNOSIS — Z1231 Encounter for screening mammogram for malignant neoplasm of breast: Secondary | ICD-10-CM

## 2023-07-21 DIAGNOSIS — Z780 Asymptomatic menopausal state: Secondary | ICD-10-CM

## 2023-07-21 DIAGNOSIS — M8580 Other specified disorders of bone density and structure, unspecified site: Secondary | ICD-10-CM

## 2023-07-22 ENCOUNTER — Encounter: Payer: Self-pay | Admitting: Family Medicine

## 2023-07-22 NOTE — Progress Notes (Signed)
 Hi Azucena, your bone density shows a T-score -1.1 this puts you into the mildly thin bone category called osteopenia.  Please see recommendations below.  In addition recommend repeat DEXA in 2 years.   The current recommendation for osteopenia (mildly thin bones) treatment includes:   #1 calcium-total of 1200 mg of calcium daily.  If you eat a very calcium rich diet you may be able to obtain that without a supplement.  If not, then I recommend calcium 500 mg twice a day.  There are several products over-the-counter such as Caltrate D and Viactiv chews which are great options that contain calcium and vitamin D. #2 vitamin D-recommend 25 to 50 mcg/day.  Being that your recent labs showed your vitamin D on the low end I would probably start with the 50 mcg.  And then we can always recheck your vitamin D level in 8 to 12 weeks to make sure that it is improving. #3 exercise-recommend 30 minutes of weightbearing exercise 3 days a week.  Resistance training ,such as doing bands and light weights, can be particularly helpful.

## 2023-07-22 NOTE — Progress Notes (Signed)
 Please call patient. Normal mammogram.  Repeat in 1 year.

## 2023-08-31 DIAGNOSIS — M25552 Pain in left hip: Secondary | ICD-10-CM | POA: Diagnosis not present

## 2023-08-31 DIAGNOSIS — M1612 Unilateral primary osteoarthritis, left hip: Secondary | ICD-10-CM | POA: Diagnosis not present

## 2023-09-15 ENCOUNTER — Ambulatory Visit: Payer: Self-pay

## 2023-09-15 NOTE — Telephone Encounter (Signed)
  Chief Complaint: painful, frequent urination Symptoms: pain, increased frequency,  Frequency: 5 days Pertinent Negatives: Patient denies fever, abd pain, flank pain, blood in urine Disposition: [] ED /[] Urgent Care (no appt availability in office) / [x] Appointment(In office/virtual)/ []  [facility]/ [] Home Care/ [] Refused Recommended Disposition /[] [facility]/ []  Follow-up with PCP Additional Notes: Pt states hx of uti's but so long ago that she cannot remember the s/s. Pt states this has been ongoing for 5 days. States s/s are painful urination, increased frequency. Pt scheduled tomorrow.  Copied from CRM [id number]. Topic: Clinical - Red Word Triage >> Sep 15, 2023  8:31 AM Carolyn [name] wrote: [facility] that prompted transfer to Nurse Triage: painful/burning/feeling full but can't urinate or empty completely purchased Azo but it's not helping Reason for Disposition  Urinating more frequently than usual (i.e., frequency)  Answer Assessment - Initial Assessment Questions 1. SYMPTOM: "What's the main symptom you're concerned about?" (e.g., frequency, incontinence)     Painful, burning, unable to fully empty bladder 2. ONSET: "When did the  painful urination  start?"     5 days 3. PAIN: "Is there any pain?" If Yes, ask: "How bad is it?" (Scale: 1-10; mild, moderate, severe)     Worst has been 4, typically 1 or 2 4. CAUSE: "What do you think is causing the symptoms?"     unsure 5. OTHER SYMPTOMS: "Do you have any other symptoms?" (e.g., blood in urine, fever, flank pain, pain with urination)     denies  Protocols used: Urinary Symptoms-A-AH

## 2023-09-16 ENCOUNTER — Encounter: Payer: Self-pay | Admitting: Family Medicine

## 2023-09-16 ENCOUNTER — Ambulatory Visit (INDEPENDENT_AMBULATORY_CARE_PROVIDER_SITE_OTHER): Admitting: Family Medicine

## 2023-09-16 VITALS — BP 126/84 | HR 70 | Ht 65.0 in | Wt 208.0 lb

## 2023-09-16 DIAGNOSIS — M797 Fibromyalgia: Secondary | ICD-10-CM

## 2023-09-16 DIAGNOSIS — K080 Exfoliation of teeth due to systemic causes: Secondary | ICD-10-CM | POA: Diagnosis not present

## 2023-09-16 DIAGNOSIS — R350 Frequency of micturition: Secondary | ICD-10-CM | POA: Diagnosis not present

## 2023-09-16 LAB — POCT URINALYSIS DIP (CLINITEK)
Bilirubin, UA: NEGATIVE
Blood, UA: NEGATIVE
Glucose, UA: NEGATIVE mg/dL
Ketones, POC UA: NEGATIVE mg/dL
Nitrite, UA: NEGATIVE
POC PROTEIN,UA: NEGATIVE
Spec Grav, UA: 1.01 (ref 1.010–1.025)
Urobilinogen, UA: 0.2 U/dL
pH, UA: 5.5 (ref 5.0–8.0)

## 2023-09-16 MED ORDER — NITROFURANTOIN MONOHYD MACRO 100 MG PO CAPS: 100.0000 mg | ORAL_CAPSULE | Freq: Two times a day (BID) | ORAL | 0 refills | Status: AC

## 2023-09-16 MED ORDER — CYCLOBENZAPRINE HCL 5 MG PO TABS: ORAL_TABLET | ORAL | 0 refills | Status: AC

## 2023-09-16 MED ORDER — HYDROXYZINE HCL 25 MG PO TABS: 25.0000 mg | ORAL_TABLET | Freq: Three times a day (TID) | ORAL | 0 refills | Status: AC | PRN

## 2023-09-16 NOTE — Assessment & Plan Note (Signed)
Refill sent for Flexeril.

## 2023-09-16 NOTE — Progress Notes (Signed)
 Acute Office Visit  Subjective:     Patient ID: Carolyn [name], female    DOB: 1956-04-07, 68 y.o.   MRN: [id number]  Chief Complaint  Patient presents with   Urinary Frequency    X1 week. She took Azo on Tuesday and yesterday. Denies any f/s/c/n/v abdominal,back or flank pain    HPI Patient is in today for urinary freq x 1 week. Says it can occur sporadically. Can go to th ebathroom every hour for 4 hours and then seems better. No hematuria. No fever. Some cjhills after urination.    Would like refill on some meds. Has eye appt this summer and will get restasis refilled then.    She also wanted to give me an update she did go see the orthopedist about her left hip which Bothering her after her foot healed.  They said that she really needs hip replacement she will probably wait until August to have that done.  Took Azo yestereday and Tuesday.     ROS      Objective:     BP 126/84   Pulse 70   Ht 5\' 5"  (1.651 m)   Wt 208 lb 0.6 oz (94.4 kg)   SpO2 95%   BMI 34.62 kg/m    Physical Exam  Results for orders placed or performed in visit on 09/16/23  POCT URINALYSIS DIP (CLINITEK)  Result Value Ref Range   Color, UA yellow yellow   Clarity, UA clear clear   Glucose, UA negative negative mg/dL   Bilirubin, UA negative negative   Ketones, POC UA negative negative mg/dL   Spec Grav, UA 4.098 1.191 - 1.025   Blood, UA negative negative   pH, UA 5.5 5.0 - 8.0   POC PROTEIN,UA negative negative, trace   Urobilinogen, UA 0.2 0.2 or 1.0 E.U./dL   Nitrite, UA Negative Negative   Leukocytes, UA Trace (A) Negative        Assessment & Plan:   Problem List Items Addressed This Visit       Other   Fibromyalgia   Refill sent for Flexeril .      Relevant Medications   cyclobenzaprine  (FLEXERIL ) 5 MG tablet   Other Visit Diagnoses       Urinary frequency    -  Primary   Relevant Medications   nitrofurantoin, macrocrystal-monohydrate, (MACROBID) 100 MG capsule    Other Relevant Orders   POCT URINALYSIS DIP (CLINITEK) (Completed)      Urinary frequency-dipstick only positive for leukocytes she is actually gena be traveling this weekend so we discussed starting an antibiotic and then if the culture comes back negative we can stop it if she is still having frequency and urgency then we can always refer to urology for further workup.  Meds ordered this encounter  Medications   cyclobenzaprine  (FLEXERIL ) 5 MG tablet    Sig: TAKE 1 OR 2 TABLETS BY MOUTH DAILY AT BEDTIME AS NEEDED FOR MUSCLE SPASMS    Dispense:  60 tablet    Refill:  0   hydrOXYzine  (ATARAX ) 25 MG tablet    Sig: Take 1 tablet (25 mg total) by mouth 3 (three) times daily as needed for itching.    Dispense:  30 tablet    Refill:  0   nitrofurantoin, macrocrystal-monohydrate, (MACROBID) 100 MG capsule    Sig: Take 1 capsule (100 mg total) by mouth 2 (two) times daily.    Dispense:  10 capsule    Refill:  0  No follow-ups on file.  Duaine German, MD

## 2023-09-16 NOTE — Addendum Note (Signed)
 Addended by: Izora Marten on: 09/16/2023 01:31 PM   Modules accepted: Orders

## 2023-09-20 ENCOUNTER — Ambulatory Visit: Payer: Self-pay | Admitting: Family Medicine

## 2023-09-20 LAB — URINE CULTURE

## 2023-09-20 NOTE — Progress Notes (Signed)
 Carolyn [name], urine culture came back for E. coli.  On the culture results the antibiotic that I put you on should be the right 1.  So hopefully you are already feeling much better.  Just make sure to complete the full course and let us  know if you have any persistent symptoms.

## 2023-09-25 IMAGING — MG MM DIGITAL DIAGNOSTIC UNILAT*L* W/ TOMO W/ CAD
8 series · 8 of 24 positions shown · non-contrast
Comparison: 07/31/2021 and 05/29/2020 mammograms.
COMPARISON: 07/31/2021 and 05/29/2020 mammograms.

Addendum:
CLINICAL DATA: 65-year-old female for further evaluation of
possible LEFT breast distortion on screening mammogram.

EXAM:
DIGITAL DIAGNOSTIC UNILATERAL LEFT MAMMOGRAM WITH TOMOSYNTHESIS AND
CAD; ULTRASOUND LEFT BREAST LIMITED
TECHNIQUE: Left digital diagnostic mammography and breast tomosynthesis was
performed. The images were evaluated with computer-aided detection.;
Targeted ultrasound examination of the left breast was performed.

[L CC synth-2D (1 of 2)]
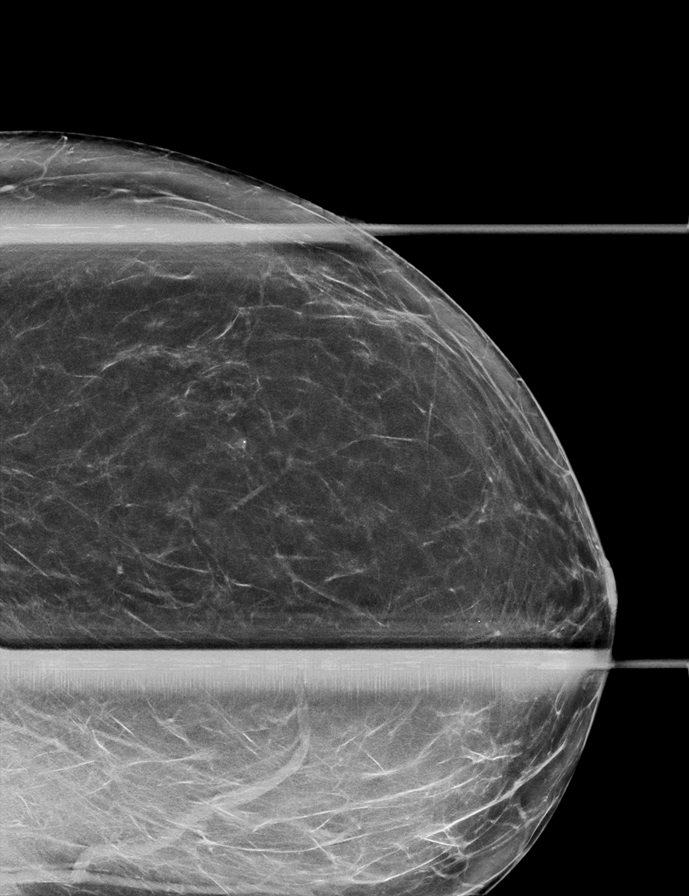

[L CC synth-2D (2 of 2)]
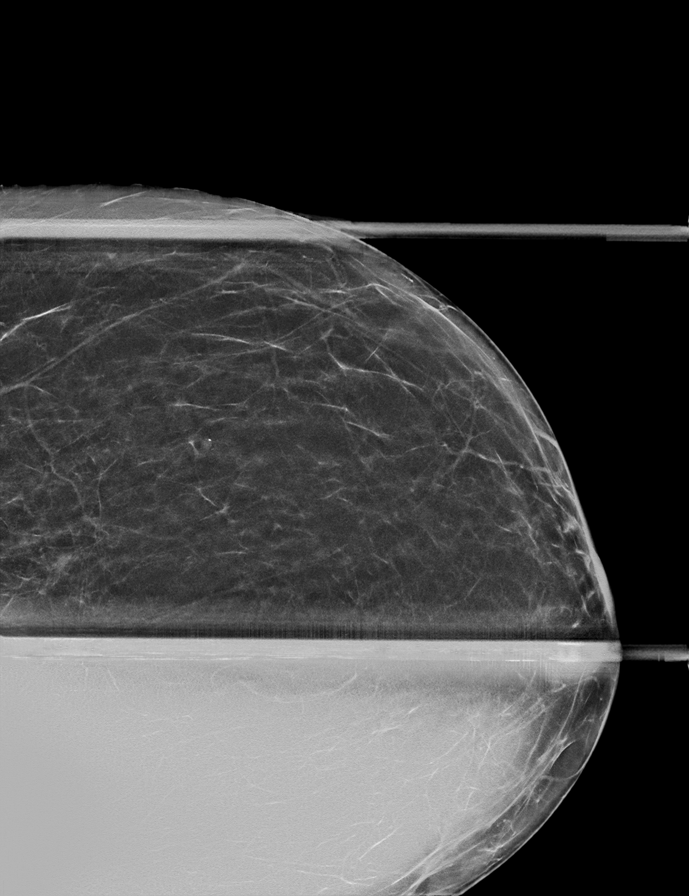

[L MLO synth-2D]
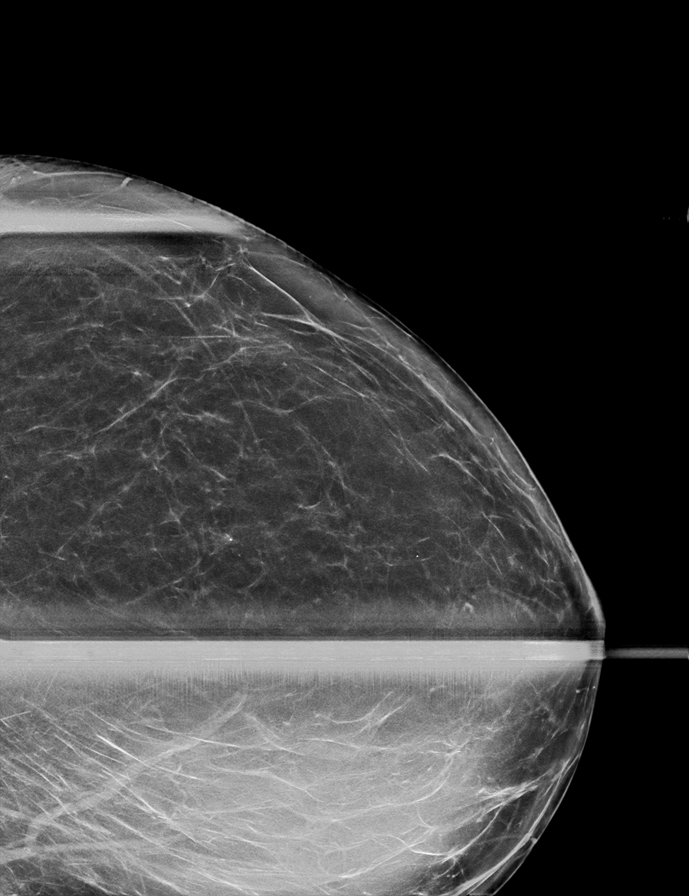

[L ML synth-2D]
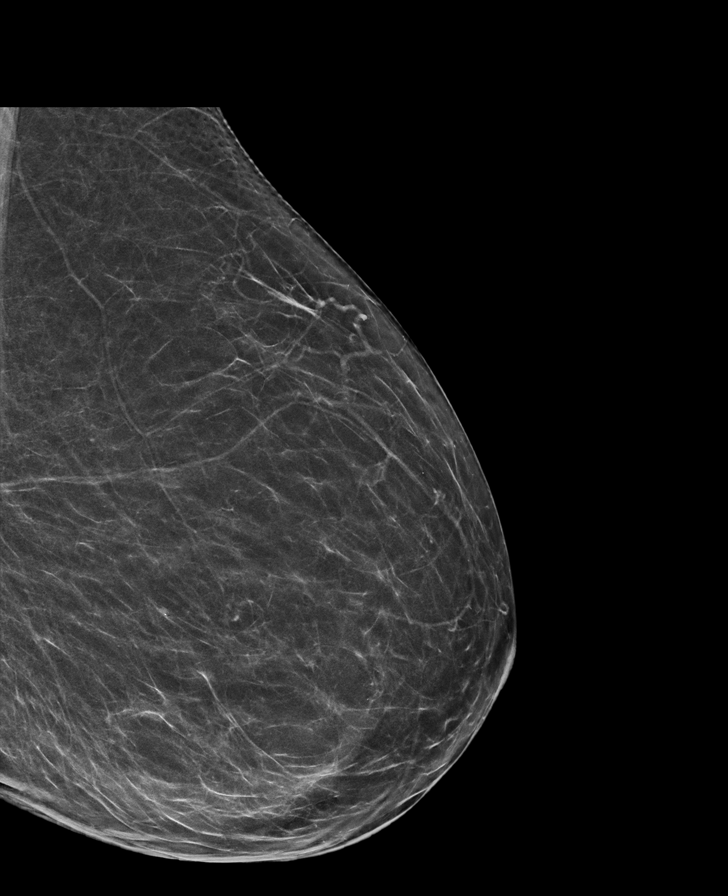

[L CC tomo (1 of 2) · tomo slice 29/58.0]
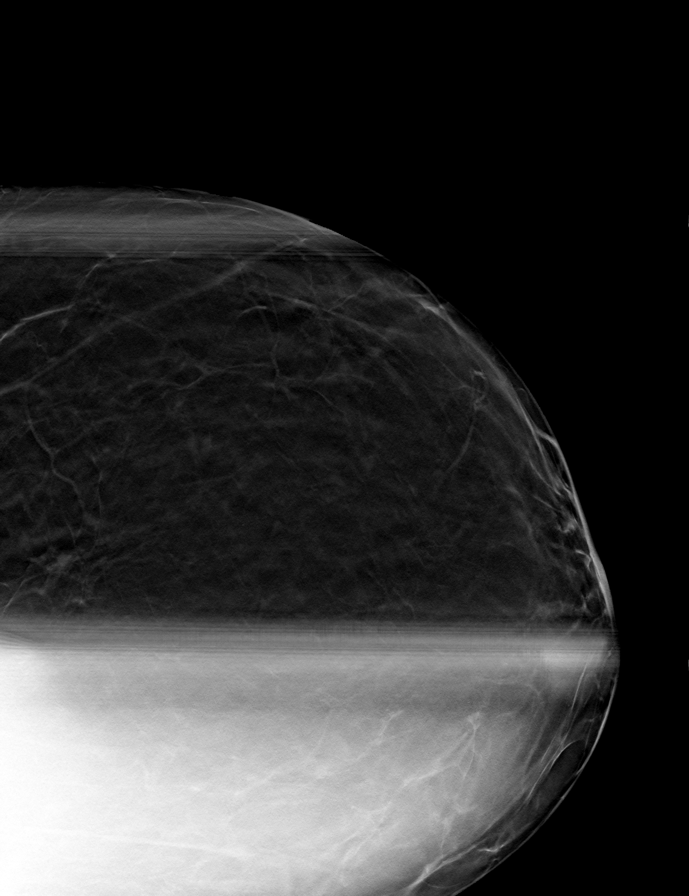

[L CC tomo (2 of 2) · tomo slice 35/70.0]
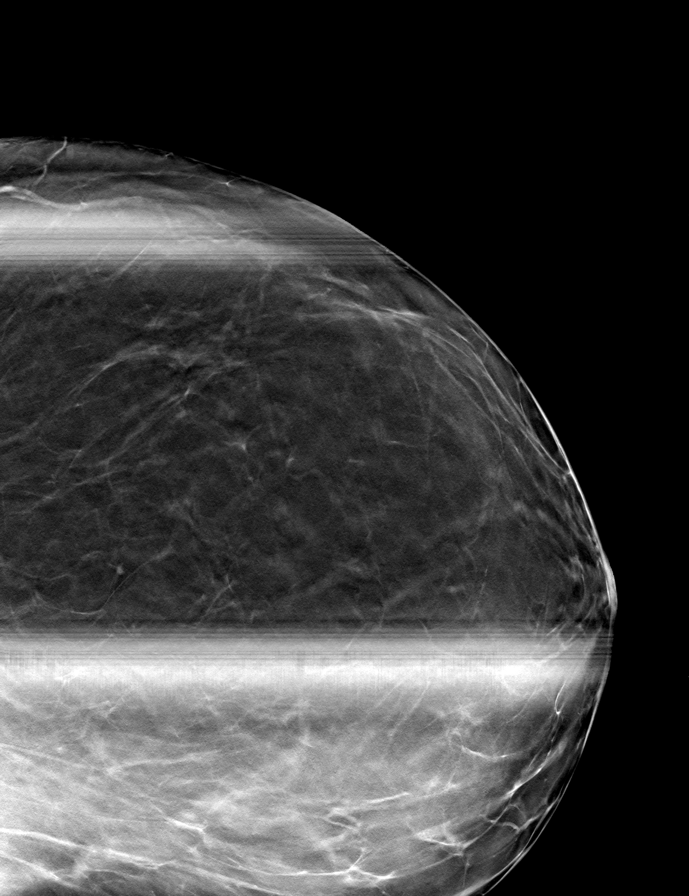

[L ML tomo · tomo slice 34/67.0]
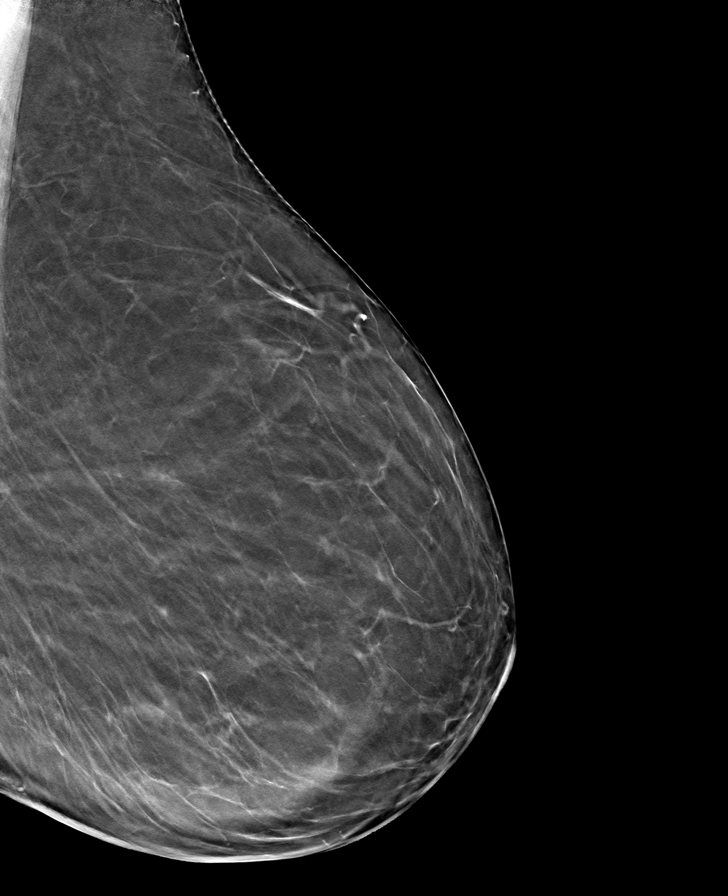

[L MLO tomo · tomo slice 36/71.0]
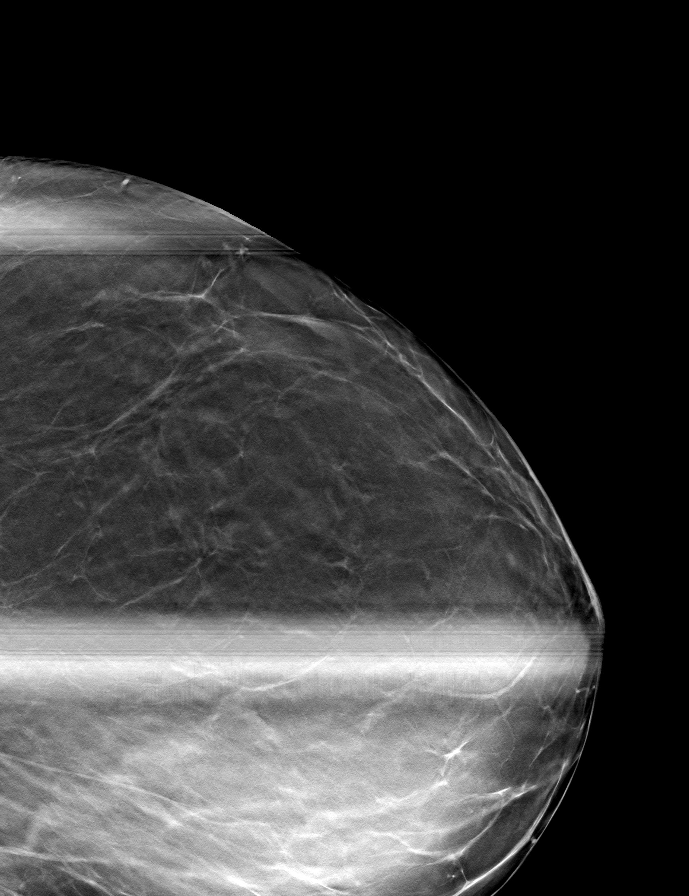

[8 of 24 positions shown; findings below may reference images not displayed]

The patient
states that she does have older prior mammograms at outside facility
in [HOSPITAL].

ACR Breast Density Category a: The breast tissue is almost entirely
fatty.
FINDINGS: 2D/3D full field and spot compression views of the LEFT breast
demonstrate mild persistent asymmetry within the OUTER LEFT breast,
without distortion identified.

Targeted ultrasound is performed, showing no sonographic
abnormalities within the UPPER or UPPER-OUTER LEFT breast, in the
area of the screening study finding.
IMPRESSION: 1. Improved appearance of the OUTER LEFT breast screening study
abnormality with mild persistent asymmetry in this area. Six-month
follow-up recommended to ensure stability. If we are able to
retrieve prior outside studies, a comparison and further
recommendations will be issued.

RECOMMENDATION:
LEFT diagnostic mammogram with possible LEFT breast ultrasound in 6
months.

I have discussed the findings and recommendations with the patient.
If applicable, a reminder letter will be sent to the patient
regarding the next appointment.

BI-RADS CATEGORY  3: Probably benign.

ADDENDUM:
Outside mammograms from [REDACTED] dated 02/21/2014 to 02/08/2017
have now been obtained.

The mild persistent asymmetry within the OUTER LEFT breast is
unchanged from 6332 and considered benign. No further imaging
follow-up recommended.

BI-RADS CATEGORY: 2: Benign.

RECOMMENDATION: Bilateral screening mammogram in 1 year.

*** End of Addendum ***
The patient
states that she does have older prior mammograms at outside facility
in [HOSPITAL].

ACR Breast Density Category a: The breast tissue is almost entirely
fatty.
FINDINGS: 2D/3D full field and spot compression views of the LEFT breast
demonstrate mild persistent asymmetry within the OUTER LEFT breast,
without distortion identified.

Targeted ultrasound is performed, showing no sonographic
abnormalities within the UPPER or UPPER-OUTER LEFT breast, in the
area of the screening study finding.
IMPRESSION: 1. Improved appearance of the OUTER LEFT breast screening study
abnormality with mild persistent asymmetry in this area. Six-month
follow-up recommended to ensure stability. If we are able to
retrieve prior outside studies, a comparison and further
recommendations will be issued.

RECOMMENDATION:
LEFT diagnostic mammogram with possible LEFT breast ultrasound in 6
months.

I have discussed the findings and recommendations with the patient.
If applicable, a reminder letter will be sent to the patient
regarding the next appointment.

BI-RADS CATEGORY  3: Probably benign.

## 2023-12-28 DIAGNOSIS — H5210 Myopia, unspecified eye: Secondary | ICD-10-CM | POA: Diagnosis not present

## 2024-05-01 ENCOUNTER — Telehealth

## 2024-05-01 DIAGNOSIS — J101 Influenza due to other identified influenza virus with other respiratory manifestations: Secondary | ICD-10-CM | POA: Diagnosis not present

## 2024-05-01 MED ORDER — OSELTAMIVIR PHOSPHATE 75 MG PO CAPS: 75.0000 mg | ORAL_CAPSULE | Freq: Two times a day (BID) | ORAL | 0 refills | Status: AC

## 2024-05-01 MED ORDER — PROMETHAZINE-DM 6.25-15 MG/5ML PO SYRP: 5.0000 mL | ORAL_SOLUTION | Freq: Four times a day (QID) | ORAL | 0 refills | Status: AC | PRN

## 2024-05-01 MED ORDER — BENZONATATE 100 MG PO CAPS: 100.0000 mg | ORAL_CAPSULE | Freq: Three times a day (TID) | ORAL | 0 refills | Status: AC | PRN

## 2024-05-01 MED ORDER — GUAIFENESIN ER 600 MG PO TB12: 600.0000 mg | ORAL_TABLET | Freq: Two times a day (BID) | ORAL | 0 refills | Status: AC | PRN

## 2024-05-01 NOTE — Progress Notes (Signed)
 " Virtual Visit Consent   Carolyn [name], you are scheduled for a virtual visit with a [facility] provider today. Just as with appointments in the office, your consent must be obtained to participate. Your consent will be active for this visit and any virtual visit you may have with one of our providers in the next 365 days. If you have a MyChart account, a copy of this consent can be sent to you electronically.  As this is a virtual visit, video technology does not allow for your provider to perform a traditional examination. This may limit your provider's ability to fully assess your condition. If your provider identifies any concerns that need to be evaluated in person or the need to arrange testing (such as labs, EKG, etc.), we will make arrangements to do so. Although advances in technology are sophisticated, we cannot ensure that it will always work on either your end or our end. If the connection with a video visit is poor, the visit may have to be switched to a telephone visit. With either a video or telephone visit, we are not always able to ensure that we have a secure connection.  By engaging in this virtual visit, you consent to the provision of healthcare and authorize for your insurance to be billed (if applicable) for the services provided during this visit. Depending on your insurance coverage, you may receive a charge related to this service.  I need to obtain your verbal consent now. Are you willing to proceed with your visit today? [name] has provided verbal consent on 05/01/2024 for a virtual visit (video or telephone). Delon CHRISTELLA Dickinson, PA-C  Date: 05/01/2024 10:15 AM   Virtual Visit via Video Note   I, Delon CHRISTELLA Dickinson, connected with  Carolyn [name]  ([id number], 11-08-1955) on 05/01/2024 at 10:00 AM EST by a video-enabled telemedicine application and verified that I am speaking with the correct person using two identifiers.  Location: Patient: Virtual Visit  Location Patient: Home Provider: Virtual Visit Location Provider: Home Office   I discussed the limitations of evaluation and management by telemedicine and the availability of in person appointments. The patient expressed understanding and agreed to proceed.    History of Present Illness: Carolyn [name] is a 68 y.o. who identifies as a female who was assigned female at birth, and is being seen today for Influenza A.  HPI: URI  This is a new problem. The current episode started in the past 7 days (Tested positive for Influenza A on at home test; Symptoms started [facility]). The problem has been gradually worsening. The maximum temperature recorded prior to her arrival was 100.4 - 100.9 F (99.9-100.7). The fever has been present for Less than 1 day. Associated symptoms include congestion, coughing (mostly dry), diarrhea (yesterday), headaches and sneezing. Pertinent negatives include no ear pain, nausea, plugged ear sensation, rhinorrhea, sinus pain, sore throat, vomiting or wheezing. Associated symptoms comments: Body aches, decreased energy/weakness, decreased appetite. She has tried nothing for the symptoms. The treatment provided no relief.     Problems:  Patient Active Problem List   Diagnosis Date Noted   Closed fracture of distal end of left fibula 05/28/2023   Nondisplaced fracture of proximal phalanx of left lesser toe(s), initial encounter for closed fracture 05/28/2023   At risk for osteoporosis 05/30/2020   Osteopenia 05/29/2020   Prediabetes 03/07/2020   Other hyperlipidemia 03/07/2020   Other fatigue 02/22/2020   SOB (shortness of breath) on exertion 02/22/2020   Osteoarthritis 02/22/2020  Mood disorder 02/22/2020   Anemia    Menopause present 03/03/2019   Endometriosis 03/03/2019   Dry eye syndrome 01/18/2015   Mixed hyperlipidemia 05/15/2013   Neck pain, musculoskeletal 06/13/2012   Class 1 obesity due to excess calories with body mass index (BMI) of 30.0 to 30.9  in adult 12/10/2011   Vitamin D  deficiency 06/04/2010   DEPRESSION/ANXIETY 04/02/2010   OVERACTIVE BLADDER 07/31/2009   Fibromyalgia 07/17/2009   FOOT PAIN, BILATERAL 07/17/2009   STRESS INCONTINENCE 07/17/2009    Allergies: Allergies[1] Medications: Current Medications[2]  Observations/Objective: Patient is well-developed, well-nourished in no acute distress.  Resting comfortably at home.  Head is normocephalic, atraumatic.  No labored breathing.  Speech is clear and coherent with logical content.  Patient is alert and oriented at baseline.    Assessment and Plan: 1. Influenza A (Primary) - oseltamivir  (TAMIFLU ) 75 MG capsule; Take 1 capsule (75 mg total) by mouth 2 (two) times daily.  Dispense: 10 capsule; Refill: 0 - guaiFENesin (MUCINEX) 600 MG 12 hr tablet; Take 1-2 tablets (600-1,200 mg total) by mouth 2 (two) times daily as needed.  Dispense: 30 tablet; Refill: 0 - benzonatate (TESSALON) 100 MG capsule; Take 1-2 capsules (100-200 mg total) by mouth 3 (three) times daily as needed.  Dispense: 30 capsule; Refill: 0  - Positive for Influenza A - Tamiflu  prescribed - Tessalon perles and Promethazine DM for cough - Mucinex added for daytime cough and congestion - Continue OTC medication of choice for symptomatic management - Push fluids - Rest - Seek in person evaluation if symptoms worsen or fail to improve   Follow Up Instructions: I discussed the assessment and treatment plan with the patient. The patient was provided an opportunity to ask questions and all were answered. The patient agreed with the plan and demonstrated an understanding of the instructions.  A copy of instructions were sent to the patient via MyChart unless otherwise noted below.    The patient was advised to call back or seek an in-person evaluation if the symptoms worsen or if the condition fails to improve as anticipated.    Delon HERO Harriet Sutphen, PA-C     [1] No Known Allergies [2]  Current  Outpatient Medications:    benzonatate (TESSALON) 100 MG capsule, Take 1-2 capsules (100-200 mg total) by mouth 3 (three) times daily as needed., Disp: 30 capsule, Rfl: 0   guaiFENesin (MUCINEX) 600 MG 12 hr tablet, Take 1-2 tablets (600-1,200 mg total) by mouth 2 (two) times daily as needed., Disp: 30 tablet, Rfl: 0   oseltamivir  (TAMIFLU ) 75 MG capsule, Take 1 capsule (75 mg total) by mouth 2 (two) times daily., Disp: 10 capsule, Rfl: 0   promethazine-dextromethorphan (PROMETHAZINE-DM) 6.25-15 MG/5ML syrup, Take 5 mLs by mouth 4 (four) times daily as needed., Disp: 118 mL, Rfl: 0   Ascorbic Acid (VITAMIN C PO), Take 1,000 mg by mouth daily., Disp: , Rfl:    calcium  carbonate (OS-CAL - DOSED IN MG OF ELEMENTAL CALCIUM ) 1250 (500 Ca) MG tablet, Take 1 tablet by mouth., Disp: , Rfl:    Cholecalciferol (VITAMIN D -3 PO), Take 5,000 Units by mouth daily., Disp: , Rfl:    cyclobenzaprine  (FLEXERIL ) 5 MG tablet, TAKE 1 OR 2 TABLETS BY MOUTH DAILY AT BEDTIME AS NEEDED FOR MUSCLE SPASMS, Disp: 60 tablet, Rfl: 0   cycloSPORINE (RESTASIS) 0.05 % ophthalmic emulsion, 1 drop 2 (two) times daily., Disp: , Rfl:    hydrOXYzine  (ATARAX ) 25 MG tablet, Take 1 tablet (25 mg total) by mouth  3 (three) times daily as needed for itching., Disp: 30 tablet, Rfl: 0   Multiple Vitamins-Minerals (ZINC PO), Take by mouth. 22 mg/800 mg daily, Disp: , Rfl:    nitrofurantoin , macrocrystal-monohydrate, (MACROBID ) 100 MG capsule, Take 1 capsule (100 mg total) by mouth 2 (two) times daily., Disp: 10 capsule, Rfl: 0   rosuvastatin  (CRESTOR ) 10 MG tablet, Take 1 tablet (10 mg total) by mouth at bedtime., Disp: 100 tablet, Rfl: 3  "

## 2024-05-01 NOTE — Patient Instructions (Signed)
 " Carolyn [name], thank you for joining Delon CHRISTELLA Dickinson, PA-C for today's virtual visit.  While this provider is not your primary care provider (PCP), if your PCP is located in our provider database this encounter information will be shared with them immediately following your visit.   A [facility] MyChart account gives you access to today's visit and all your visits, tests, and labs performed at [facility]  click here if you don't have a [facility] MyChart account or go to [email or web address]  Consent: (Patient) Carolyn [name] provided verbal consent for this virtual visit at the beginning of the encounter.  Current Medications:  Current Outpatient Medications:    Ascorbic Acid (VITAMIN C PO), Take 1,000 mg by mouth daily., Disp: , Rfl:    benzonatate (TESSALON) 100 MG capsule, Take 1-2 capsules (100-200 mg total) by mouth 3 (three) times daily as needed., Disp: 30 capsule, Rfl: 0   calcium  carbonate (OS-CAL - DOSED IN MG OF ELEMENTAL CALCIUM ) 1250 (500 Ca) MG tablet, Take 1 tablet by mouth., Disp: , Rfl:    Cholecalciferol (VITAMIN D -3 PO), Take 5,000 Units by mouth daily., Disp: , Rfl:    cyclobenzaprine  (FLEXERIL ) 5 MG tablet, TAKE 1 OR 2 TABLETS BY MOUTH DAILY AT BEDTIME AS NEEDED FOR MUSCLE SPASMS, Disp: 60 tablet, Rfl: 0   cycloSPORINE (RESTASIS) 0.05 % ophthalmic emulsion, 1 drop 2 (two) times daily., Disp: , Rfl:    guaiFENesin (MUCINEX) 600 MG 12 hr tablet, Take 1-2 tablets (600-1,200 mg total) by mouth 2 (two) times daily as needed., Disp: 30 tablet, Rfl: 0   hydrOXYzine  (ATARAX ) 25 MG tablet, Take 1 tablet (25 mg total) by mouth 3 (three) times daily as needed for itching., Disp: 30 tablet, Rfl: 0   Multiple Vitamins-Minerals (ZINC PO), Take by mouth. 22 mg/800 mg daily, Disp: , Rfl:    nitrofurantoin , macrocrystal-monohydrate, (MACROBID ) 100 MG capsule, Take 1 capsule (100 mg total) by mouth 2 (two) times daily., Disp: 10 capsule, Rfl: 0   oseltamivir   (TAMIFLU ) 75 MG capsule, Take 1 capsule (75 mg total) by mouth 2 (two) times daily., Disp: 10 capsule, Rfl: 0   promethazine-dextromethorphan (PROMETHAZINE-DM) 6.25-15 MG/5ML syrup, Take 5 mLs by mouth 4 (four) times daily as needed., Disp: 118 mL, Rfl: 0   rosuvastatin  (CRESTOR ) 10 MG tablet, Take 1 tablet (10 mg total) by mouth at bedtime., Disp: 100 tablet, Rfl: 3   Medications ordered in this encounter:  Meds ordered this encounter  Medications   oseltamivir  (TAMIFLU ) 75 MG capsule    Sig: Take 1 capsule (75 mg total) by mouth 2 (two) times daily.    Dispense:  10 capsule    Refill:  0    Supervising Provider:   LAMPTEY, PHILIP O [[id number]]   guaiFENesin (MUCINEX) 600 MG 12 hr tablet    Sig: Take 1-2 tablets (600-1,200 mg total) by mouth 2 (two) times daily as needed.    Dispense:  30 tablet    Refill:  0    Supervising Provider:   LAMPTEY, PHILIP O [[id number]]   benzonatate (TESSALON) 100 MG capsule    Sig: Take 1-2 capsules (100-200 mg total) by mouth 3 (three) times daily as needed.    Dispense:  30 capsule    Refill:  0    Supervising Provider:   LAMPTEY, PHILIP O [[id number]]   promethazine-dextromethorphan (PROMETHAZINE-DM) 6.25-15 MG/5ML syrup    Sig: Take 5 mLs by mouth 4 (four) times daily as needed.    Dispense:  118  mL    Refill:  0    Supervising Provider:   BLAISE ALEENE KIDD [[id number]]     *If you need refills on other medications prior to your next appointment, please contact your pharmacy*  Follow-Up: Call back or seek an in-person evaluation if the symptoms worsen or if the condition fails to improve as anticipated.  [facility] [phone]  Other Instructions Influenza, Adult Influenza is also called the flu. It's an infection that affects your respiratory tract. This includes your nose, throat, windpipe, and lungs. The flu is contagious. This means it spreads easily from person to person. It causes symptoms that are like a cold. It can also cause  a high fever and body aches. What are the causes? The flu is caused by the influenza virus. You can get it by: Breathing in droplets that are in the air after an infected person coughs or sneezes. Touching something that has the virus on it and then touching your mouth, nose, or eyes. What increases the risk? You may be more likely to get the flu if: You don't wash your hands often. You're near a lot of people during cold and flu season. You touch your mouth, eyes, or nose without washing your hands first. You don't get a flu shot each year. You may also be more at risk for the flu and serious problems, such as a lung infection called pneumonia, if: You're older than 65. You're pregnant. Your immune system is weak. Your immune system is your body's defense system. You have a long-term, or chronic, condition, such as: Heart, kidney, or lung disease. Diabetes. A liver disorder. Asthma. You're very overweight. You have anemia. This is when you don't have enough red blood cells in your body. What are the signs or symptoms? Flu symptoms often start all of a sudden. They may last 4-14 days and include: Fever and chills. Headaches, body aches, or muscle aches. Sore throat. Cough. Runny or stuffy nose. Discomfort in your chest. Not wanting to eat as much as normal. Feeling weak or tired. Feeling dizzy. Nausea or vomiting. How is this diagnosed? The flu may be diagnosed based on your symptoms and medical history. You may also have a physical exam. A swab may be taken from your nose or throat and tested for the virus. How is this treated? If the flu is found early, you can be treated with antiviral medicine. This may be given to you by mouth or through an IV. It can help you feel less sick and get better faster. Taking care of yourself at home can also help your symptoms get better. Your health care provider may tell you to: Take over-the-counter medicines. Drink lots of fluids. The  flu often goes away on its own. If you have very bad symptoms or problems caused by the flu, you may need to be treated in a hospital. Follow these instructions at home: Activity Rest as needed. Get lots of sleep. Stay home from work or school as told by your provider. Leave home only to go see your provider. Do not leave home for other reasons until you don't have a fever for 24 hours without taking medicine. Eating and drinking Take an oral rehydration solution (ORS). This is a drink that is sold at pharmacies and stores. Drink enough fluid to keep your pee pale yellow. Try to drink small amounts of clear fluids. These include water, ice chips, fruit juice mixed with water, and low-calorie sports drinks. Try  to eat bland foods that are easy to digest. These include bananas, applesauce, rice, lean meats, toast, and crackers. Avoid drinks that have a lot of sugar or caffeine in them. These include energy drinks, regular sports drinks, and soda. Do not drink alcohol. Do not eat spicy or fatty foods. General instructions     Take your medicines only as told by your provider. Use a cool mist humidifier to add moisture to the air in your home. This can make it easier for you to breathe. You should also clean the humidifier every day. To do so: Empty the water. Pour clean water in. Cover your mouth and nose when you cough or sneeze. Wash your hands with soap and water often and for at least 20 seconds. It's extra important to do so after you cough or sneeze. If you can't use soap and water, use hand sanitizer. How is this prevented?  Get a flu shot every year. Ask your provider when you should get your flu shot. Stay away from people who are sick during fall and winter. Fall and winter are cold and flu season. Contact a health care provider if: You get new symptoms. You have chest pain. You have watery poop, also called diarrhea. You have a fever. Your cough gets worse. You start to  have more mucus. You feel like you may vomit, or you vomit. Get help right away if: You become short of breath or have trouble breathing. Your skin or nails turn blue. You have very bad pain or stiffness in your neck. You get a sudden headache or pain in your face or ear. You vomit each time you eat or drink. These symptoms may be an emergency. Call 911 right away. Do not wait to see if the symptoms will go away. Do not drive yourself to the hospital. This information is not intended to replace advice given to you by your health care provider. Make sure you discuss any questions you have with your health care provider. Document Revised: 01/21/2023 Document Reviewed: 05/28/2022 Elsevier Patient Education  2024 [employer]   If you have been instructed to have an in-person evaluation today at a local Urgent Care facility, please use the link below. It will take you to a list of all of our available [facility], including address, phone number and hours of operation. Please do not delay care.  [facility]  If you or a family member do not have a primary care provider, use the link below to schedule a visit and establish care. When you choose a [facility] primary care physician or advanced practice provider, you gain a long-term partner in health. Find a Primary Care Provider  Learn more about [facility] in-office and virtual care options: [facility] Get Care Now "

## 2024-06-20 ENCOUNTER — Ambulatory Visit: Payer: Medicare Other

## 2024-06-27 ENCOUNTER — Encounter: Payer: Medicare Other | Admitting: Family Medicine
